# Patient Record
Sex: Male | Born: 1955 | Race: Black or African American | Hispanic: No | Marital: Single | Smoking: Former smoker
Health system: Southern US, Community
[De-identification: ages and names within clinical notes are randomized; demographics above are authoritative.]

## PROBLEM LIST (undated history)

## (undated) DIAGNOSIS — U071 COVID-19: Secondary | ICD-10-CM

## (undated) DIAGNOSIS — J449 Chronic obstructive pulmonary disease, unspecified: Secondary | ICD-10-CM

## (undated) HISTORY — DX: COVID-19: U07.1

---

## 2007-04-26 ENCOUNTER — Emergency Department: Payer: Self-pay | Admitting: Emergency Medicine

## 2012-07-08 ENCOUNTER — Emergency Department: Payer: Self-pay | Admitting: Unknown Physician Specialty

## 2012-07-09 LAB — COMPREHENSIVE METABOLIC PANEL
Alkaline Phosphatase: 62 U/L (ref 50–136)
Anion Gap: 9 (ref 7–16)
BUN: 20 mg/dL — ABNORMAL HIGH (ref 7–18)
Bilirubin,Total: 0.3 mg/dL (ref 0.2–1.0)
Calcium, Total: 8.9 mg/dL (ref 8.5–10.1)
Chloride: 112 mmol/L — ABNORMAL HIGH (ref 98–107)
Co2: 24 mmol/L (ref 21–32)
EGFR (African American): >60
EGFR (Non-African Amer.): >60
Osmolality: 291 (ref 275–301)
Potassium: 4.2 mmol/L (ref 3.5–5.1)

## 2012-07-09 LAB — CBC
HCT: 36.9 % — ABNORMAL LOW (ref 40.0–52.0)
MCH: 30.6 pg (ref 26.0–34.0)
MCHC: 32.6 g/dL (ref 32.0–36.0)
MCV: 94 fL (ref 80–100)
Platelet: 177 10*3/uL (ref 150–440)
RDW: 14.5 % (ref 11.5–14.5)

## 2012-07-09 LAB — PROTIME-INR: Prothrombin Time: 12.6 secs (ref 11.5–14.7)

## 2012-07-09 LAB — APTT: Activated PTT: 30.5 secs (ref 23.6–35.9)

## 2016-09-03 ENCOUNTER — Emergency Department
Admission: EM | Admit: 2016-09-03 | Discharge: 2016-09-03 | Disposition: A | Discharge status: Home / Self Care | Payer: Self-pay | Attending: Emergency Medicine | Admitting: Emergency Medicine

## 2016-09-03 DIAGNOSIS — K047 Periapical abscess without sinus: Secondary | ICD-10-CM | POA: Insufficient documentation

## 2016-09-03 MED ORDER — AMOXICILLIN 500 MG PO CAPS: 500.0000 mg | ORAL_CAPSULE | Freq: Three times a day (TID) | ORAL | 0 refills | Status: DC

## 2016-09-03 MED ORDER — TRAMADOL HCL 50 MG PO TABS: 50.0000 mg | ORAL_TABLET | Freq: Four times a day (QID) | ORAL | 0 refills | Status: DC | PRN

## 2016-09-03 NOTE — ED Triage Notes (Signed)
My here for dental abscess.

## 2016-09-03 NOTE — Discharge Instructions (Signed)
Begin medications today. A list of dental clinics is listed below.  OPTIONS FOR DENTAL FOLLOW UP CARE  [place] [facility] - Local Safety Net Dental Clinics [email or web address]   [facility] [phone])  [facility] [phone])  [place] [phone])  [facility] [phone])  [facility] [phone]) This clinic caters to the indigent population and is on a lottery system. Location: [employer] [facility], [employer], [address] [place] Clinic Hours: Wednesdays from 6pm - 9pm, patients seen by a lottery system. For dates, call or go to [email or web address] Services: Cleanings, fillings and simple extractions. Payment Options: DENTAL WORK IS FREE OF CHARGE. Bring proof of income or support. Best way to get seen: Arrive at 5:15 pm - this is a lottery, NOT first come/first serve, so arriving earlier will not increase your chances of being seen.     [facility] [phone] Select option 1 for emergencies   Location: [facility], [place], [address] [place] Clinic Hours: No walk-ins accepted - call the day before to schedule an appointment. Check in times are 9:30 am and 1:30 pm. Services: Simple extractions, temporary fillings, pulpectomy/pulp debridement, uncomplicated abscess drainage. Payment Options: PAYMENT IS DUE AT THE TIME OF SERVICE.  Fee is usually $100-200, additional surgical procedures (e.g. abscess drainage) may be extra. Cash, checks, Visa/MasterCard accepted.  Can file Medicaid if patient is covered for dental - patient should call case worker to check. No discount for [facility] patients. Best way to get seen: MUST call the day before and get onto the schedule. Can usually be seen the next 1-2 days. No walk-ins  accepted.     [facility] [phone]   Location: [facility], [address], [place] Clinic Hours: M, W, Th, F 8am or 1:30pm, Tues 9a or 1:30 - first come/first served. Services: Simple extractions, temporary fillings, uncomplicated abscess drainage.  You do not need to be an [facility] resident. Payment Options: PAYMENT IS DUE AT THE TIME OF SERVICE. Dental insurance, otherwise sliding scale - bring proof of income or support. Depending on income and treatment needed, cost is usually $50-200. Best way to get seen: Arrive early as it is first come/first served.     [facility] [phone]   Location: [address] Clinic Hours: Mon-Thu 8a-5p Services: Most basic dental services including extractions and fillings. Payment Options: PAYMENT IS DUE AT THE TIME OF SERVICE. Sliding scale, up to 50% off - bring proof if income or support. Medicaid with dental option accepted. Best way to get seen: Call to schedule an appointment, can usually be seen within 2 weeks OR they will try to see walk-ins - show up at 8a or 2p (you may have to wait).     [facility] [phone] [place] RESIDENTS ONLY   Location: [facility], [address], [place], [place] [address] Clinic Hours: By appointment only. Monday - Thursday 8am-5pm, Friday 8am-12pm Services: Cleanings, fillings, extractions. Payment Options: PAYMENT IS DUE AT THE TIME OF SERVICE. Cash, Visa or MasterCard. Sliding scale - $30 minimum per service. Best way to get seen: Come in to office, complete packet and make an appointment - need proof of income or support monies for each household member and proof of [facility] residence. Usually takes about a month to get in.  [facility] [phone]   Location: [address] [facility] Hours: Walk-in  Urgent Care Dental Services are offered Monday-Friday mornings only. The numbers of emergencies accepted daily is limited to the number of providers available. Maximum 15 - Mondays, Wednesdays & Thursdays Maximum 10 - Tuesdays & Fridays Services: You do not need to be a [place] [facility] resident to be seen for a dental emergency. Emergencies are defined as pain, swelling, abnormal bleeding, or dental trauma. Walkins will receive x-rays if needed. NOTE: Dental cleaning is not an emergency. Payment Options: PAYMENT IS DUE AT THE TIME OF SERVICE. Minimum co-pay is $40.00 for uninsured patients. Minimum co-pay is $3.00 for Medicaid with dental coverage. Dental Insurance is accepted and must be presented at time of visit. Medicare does not cover dental. Forms of payment: Cash, credit card, checks. Best way to get seen: If not previously registered with the clinic, walk-in dental registration begins at 7:15 am and is on a first come/first serve basis. If previously registered with the clinic, call to make an appointment.     [facility] [phone] [place] RESIDENTS ONLY   Location: [address], [place], [place] Clinic Hours: Mon-Thu 10a-2p Services: Extractions only! Payment Options: FREE (donations accepted) - bring proof of income or support Best way to get seen: Call and schedule an appointment OR come at 8am on the 1st Monday of every month (except for holidays) when it is first come/first served.     [facility] [phone]   Location: [address], [place] Clinic Hours: Friday mornings Services, Payment Options, Best way to get seen: Call for info

## 2016-09-03 NOTE — ED Provider Notes (Signed)
[facility] Emergency Department Provider Note  ____________________________________________   First MD Initiated Contact with Patient 09/03/16 1057     (approximate)  I have reviewed the triage vital signs and the nursing notes.   HISTORY  Chief Complaint Dental Problem   HPI Randy Ray is a 60 y.o. male is here with complaint of dental pain.Patient states that last evening his lower jaw was swollen however after using over-the-counter preparations and compresses it is reduced this morning. He states that he has been having problems with his teeth but have not made a dental appointment. Family member with him states he does have an appointment on Monday at [facility].  Patient denies any allergies to medications.   No past medical history on file.  There are no active problems to display for this patient.   No past surgical history on file.  Prior to Admission medications   Medication Sig Start Date End Date Taking? Authorizing Provider  amoxicillin (AMOXIL) 500 MG capsule Take 1 capsule (500 mg total) by mouth 3 (three) times daily. 09/03/16   Tommi Rumpshonda L Summers, PA-C  traMADol (ULTRAM) 50 MG tablet Take 1 tablet (50 mg total) by mouth every 6 (six) hours as needed. 09/03/16   Tommi Rumpshonda L Summers, PA-C    Allergies Review of patient's allergies indicates not on file.  No family history on file.  Social History Social History  Substance Use Topics  . Smoking status: Not on file  . Smokeless tobacco: Not on file  . Alcohol use Not on file    Review of Systems Constitutional: No fever/chills ENT: No sore throat.Positive for dental pain. Cardiovascular: Denies chest pain. Respiratory: Denies shortness of breath. Gastrointestinal:   No nausea, no vomiting.  Musculoskeletal: Negative for back pain. Skin: Negative for rash. Neurological: Negative for headaches  10-point ROS otherwise  negative.  ____________________________________________   PHYSICAL EXAM:  VITAL SIGNS: ED Triage Vitals  Enc Vitals Group     BP      Pulse      Resp      Temp      Temp src      SpO2      Weight      Height      Head Circumference      Peak Flow      Pain Score      Pain Loc      Pain Edu?      Excl. in GC?     Constitutional: Alert and oriented. Well appearing and in no acute distress. Eyes: Conjunctivae are normal. PERRL. EOMI. Head: Atraumatic. Nose: No congestion/rhinnorhea. Mouth/Throat: Mucous membranes are moist.  Oropharynx non-erythematous.For dental hygiene and repair. There are multiple teeth with enamel erosion. There is some moderate tenderness of the gum on palpation with a tongue depressor. No active drainage is seen from this area. Neck: No stridor.   Hematological/Lymphatic/Immunilogical: No cervical lymphadenopathy. Cardiovascular: Normal rate, regular rhythm. Grossly normal heart sounds.  Good peripheral circulation. Respiratory: Normal respiratory effort.  No retractions. Lungs CTAB. Musculoskeletal: No lower extremity tenderness nor edema.  No joint effusions. Neurologic:  Normal speech and language. No gross focal neurologic deficits are appreciated. No gait instability. Skin:  Skin is warm, dry and intact. No rash noted. Psychiatric: Mood and affect are normal. Speech and behavior are normal.  ____________________________________________   LABS (all labs ordered are listed, but only abnormal results are displayed)  Labs Reviewed - No data to display  PROCEDURES  Procedure(s) performed: None  Procedures  Critical Care performed: No  ____________________________________________   INITIAL IMPRESSION / ASSESSMENT AND PLAN / ED COURSE  Pertinent labs & imaging results that were available during my care of the patient were reviewed by me and considered in my medical decision making (see chart for details).    Clinical Course    Patient was given a prescription for amoxicillin 500 mg 3 times a day for 10 days along with tramadol 1 every 6 hours as needed for pain. Patient is to keep his appointment at [facility] Monday.  ____________________________________________   FINAL CLINICAL IMPRESSION(S) / ED DIAGNOSES  Final diagnoses:  Dental abscess      NEW MEDICATIONS STARTED DURING THIS VISIT:  Discharge Medication List as of 09/03/2016 11:14 AM    START taking these medications   Details  amoxicillin (AMOXIL) 500 MG capsule Take 1 capsule (500 mg total) by mouth 3 (three) times daily., Starting Fri 09/03/2016, Print    traMADol (ULTRAM) 50 MG tablet Take 1 tablet (50 mg total) by mouth every 6 (six) hours as needed., Starting Fri 09/03/2016, Print         Note:  This document was prepared using Dragon voice recognition software and may include unintentional dictation errors.    Tommi Rumps, PA-C 09/03/16 1447    Jene Every, MD 09/03/16 [phone]

## 2017-08-20 ENCOUNTER — Emergency Department
Admission: EM | Admit: 2017-08-20 | Discharge: 2017-08-20 | Disposition: A | Discharge status: Home / Self Care | Payer: Self-pay | Attending: Emergency Medicine | Admitting: Emergency Medicine

## 2017-08-20 ENCOUNTER — Encounter: Payer: Self-pay | Admitting: Emergency Medicine

## 2017-08-20 DIAGNOSIS — F17210 Nicotine dependence, cigarettes, uncomplicated: Secondary | ICD-10-CM | POA: Insufficient documentation

## 2017-08-20 DIAGNOSIS — K029 Dental caries, unspecified: Secondary | ICD-10-CM | POA: Insufficient documentation

## 2017-08-20 MED ORDER — AMOXICILLIN 500 MG PO CAPS: 500.0000 mg | ORAL_CAPSULE | Freq: Three times a day (TID) | ORAL | 0 refills | Status: DC

## 2017-08-20 MED ORDER — TRAMADOL HCL 50 MG PO TABS: 50.0000 mg | ORAL_TABLET | Freq: Four times a day (QID) | ORAL | 0 refills | Status: DC | PRN

## 2017-08-20 NOTE — ED Notes (Signed)
Discussed discharge instructions, prescriptions, and follow-up care with patient. No questions or concerns at this time. Pt stable at discharge.  

## 2017-08-20 NOTE — Discharge Instructions (Signed)
Begin taking amoxicillin 500 mg 3 times a day for 10 days and tramadol over the next 3 days as needed for pain. Follow-up with [facility] dental clinic for your multiple dental cavities.

## 2017-08-20 NOTE — ED Provider Notes (Signed)
[facility] Emergency Department Provider Note  ____________________________________________   First MD Initiated Contact with Patient 08/20/17 1126     (approximate)  I have reviewed the triage vital signs and the nursing notes.   HISTORY  Chief Complaint Dental Pain   HPI Randy Ray is a 61 y.o. male is here with complaint of dental pain that began Thursday night. He states all his teeth above are "breaking off". Patient has been seen in the past for the same teeth but has not been seen by the dentist. He states his plans are to go to [facility] on Monday for the walk-in clinic. Currently rates his pain as 10 over 10.  History reviewed. No pertinent past medical history.  There are no active problems to display for this patient.   History reviewed. No pertinent surgical history.  Prior to Admission medications   Medication Sig Start Date End Date Taking? Authorizing Provider  amoxicillin (AMOXIL) 500 MG capsule Take 1 capsule (500 mg total) by mouth 3 (three) times daily. 08/20/17   Tommi Rumps, PA-C  traMADol (ULTRAM) 50 MG tablet Take 1 tablet (50 mg total) by mouth every 6 (six) hours as needed. 08/20/17   Tommi Rumps, PA-C    Allergies Patient has no known allergies.  No family history on file.  Social History Social History  Substance Use Topics  . Smoking status: Current Every Day Smoker    Packs/day: 0.50    Types: Cigarettes  . Smokeless tobacco: Not on file  . Alcohol use Not on file    Review of Systems  Constitutional: No fever/chills Eyes: No visual changes. ENT: Positive dental pain. Cardiovascular: Denies chest pain. Respiratory: Denies shortness of breath. Gastrointestinal:  No nausea, no vomiting.   Neurological: Negative for headaches, focal weakness or numbness. ____________________________________________   PHYSICAL EXAM:  VITAL SIGNS: ED Triage Vitals  Enc Vitals Group     BP 08/20/17 1053  124/90     Pulse Rate 08/20/17 1053 79     Resp 08/20/17 1053 20     Temp 08/20/17 1053 97.9 F (36.6 C)     Temp Source 08/20/17 1053 Oral     SpO2 08/20/17 1053 100 %     Weight 08/20/17 1053 205 lb (93 kg)     Height 08/20/17 1053 5\' 8"  (1.727 m)     Head Circumference --      Peak Flow --      Pain Score 08/20/17 1052 10     Pain Loc --      Pain Edu? --      Excl. in GC? --    Constitutional: Alert and oriented. Well appearing and in no acute distress. Eyes: Conjunctivae are normal.  Head: Atraumatic. Mouth/Throat: All upper  frontal teeth are in extremely poor repair and hygiene. Most of these teeth are broken off at the gum. Area is swollen. No active drainage is noted at this time. Neck: No stridor.   Hematological/Lymphatic/Immunilogical: No cervical lymphadenopathy. Cardiovascular: Normal rate, regular rhythm. Grossly normal heart sounds.  Good peripheral circulation. Respiratory: Normal respiratory effort.  No retractions. Lungs CTAB. Musculoskeletal: Moves upper and lower extremities without difficulty and normal gait was noted. Neurologic:  Normal speech and language. No gross focal neurologic deficits are appreciated.  Psychiatric: Mood and affect are normal. Speech and behavior are normal.  ____________________________________________   LABS (all labs ordered are listed, but only abnormal results are displayed)  Labs Reviewed - No  data to display   PROCEDURES  Procedure(s) performed: None  Procedures  Critical Care performed: No  ____________________________________________   INITIAL IMPRESSION / ASSESSMENT AND PLAN / ED COURSE  Pertinent labs & imaging results that were available during my care of the patient were reviewed by me and considered in my medical decision making (see chart for details).  Patient with chronic-appearing dental caries with poor dental hygiene. Encouraged patient to follow through with [facility] dental clinic. He is given  a prescription for amoxicillin 500 mg 3 times a day 10 days and tramadol 50 mg 1 every 6 hours #12.   ____________________________________________   FINAL CLINICAL IMPRESSION(S) / ED DIAGNOSES  Final diagnoses:  Pain due to dental caries      NEW MEDICATIONS STARTED DURING THIS VISIT:  Discharge Medication List as of 08/20/2017 12:28 PM       Note:  This document was prepared using Dragon voice recognition software and may include unintentional dictation errors.    Tommi Rumps, PA-C 08/20/17 1321    Merrily Brittle, MD 08/20/17 1414

## 2017-08-20 NOTE — ED Triage Notes (Signed)
Upper front dental pain x 2 to 3 days. Has had broken tooth there.

## 2020-08-27 ENCOUNTER — Inpatient Hospital Stay
Admission: EM | Admit: 2020-08-27 | Discharge: 2020-09-13 | DRG: 177 | Disposition: A | Discharge status: Home / Self Care | Payer: HRSA Program | Attending: Internal Medicine | Admitting: Internal Medicine

## 2020-08-27 ENCOUNTER — Emergency Department: Payer: HRSA Program

## 2020-08-27 ENCOUNTER — Other Ambulatory Visit: Payer: Self-pay

## 2020-08-27 ENCOUNTER — Encounter: Payer: Self-pay | Admitting: Emergency Medicine

## 2020-08-27 DIAGNOSIS — U071 COVID-19: Principal | ICD-10-CM | POA: Diagnosis present

## 2020-08-27 DIAGNOSIS — F17210 Nicotine dependence, cigarettes, uncomplicated: Secondary | ICD-10-CM | POA: Diagnosis present

## 2020-08-27 DIAGNOSIS — J440 Chronic obstructive pulmonary disease with acute lower respiratory infection: Secondary | ICD-10-CM | POA: Diagnosis present

## 2020-08-27 DIAGNOSIS — I712 Thoracic aortic aneurysm, without rupture: Secondary | ICD-10-CM | POA: Diagnosis not present

## 2020-08-27 DIAGNOSIS — Z79899 Other long term (current) drug therapy: Secondary | ICD-10-CM

## 2020-08-27 DIAGNOSIS — J9621 Acute and chronic respiratory failure with hypoxia: Secondary | ICD-10-CM | POA: Diagnosis present

## 2020-08-27 DIAGNOSIS — R0602 Shortness of breath: Secondary | ICD-10-CM | POA: Diagnosis present

## 2020-08-27 DIAGNOSIS — J189 Pneumonia, unspecified organism: Secondary | ICD-10-CM | POA: Insufficient documentation

## 2020-08-27 DIAGNOSIS — J1282 Pneumonia due to coronavirus disease 2019: Secondary | ICD-10-CM | POA: Diagnosis present

## 2020-08-27 DIAGNOSIS — N179 Acute kidney failure, unspecified: Secondary | ICD-10-CM | POA: Diagnosis present

## 2020-08-27 DIAGNOSIS — D696 Thrombocytopenia, unspecified: Secondary | ICD-10-CM | POA: Diagnosis not present

## 2020-08-27 DIAGNOSIS — J449 Chronic obstructive pulmonary disease, unspecified: Secondary | ICD-10-CM

## 2020-08-27 DIAGNOSIS — D6959 Other secondary thrombocytopenia: Secondary | ICD-10-CM | POA: Diagnosis present

## 2020-08-27 DIAGNOSIS — J9600 Acute respiratory failure, unspecified whether with hypoxia or hypercapnia: Secondary | ICD-10-CM | POA: Diagnosis present

## 2020-08-27 DIAGNOSIS — I7122 Aneurysm of the aortic arch, without rupture: Secondary | ICD-10-CM

## 2020-08-27 DIAGNOSIS — J9601 Acute respiratory failure with hypoxia: Secondary | ICD-10-CM | POA: Diagnosis not present

## 2020-08-27 DIAGNOSIS — R0902 Hypoxemia: Secondary | ICD-10-CM

## 2020-08-27 LAB — COMPREHENSIVE METABOLIC PANEL
ALT: 33 U/L (ref 0–44)
AST: 79 U/L — ABNORMAL HIGH (ref 15–41)
Albumin: 3.9 g/dL (ref 3.5–5.0)
Alkaline Phosphatase: 50 U/L (ref 38–126)
Anion gap: 17 — ABNORMAL HIGH (ref 5–15)
BUN: 46 mg/dL — ABNORMAL HIGH (ref 8–23)
CO2: 18 mmol/L — ABNORMAL LOW (ref 22–32)
Calcium: 8.9 mg/dL (ref 8.9–10.3)
Chloride: 102 mmol/L (ref 98–111)
Creatinine, Ser: 2.57 mg/dL — ABNORMAL HIGH (ref 0.61–1.24)
GFR calc Af Amer: 30 mL/min — ABNORMAL LOW (ref >60)
GFR calc non Af Amer: 26 mL/min — ABNORMAL LOW (ref >60)
Glucose, Bld: 133 mg/dL — ABNORMAL HIGH (ref 70–99)
Potassium: 3.5 mmol/L (ref 3.5–5.1)
Sodium: 137 mmol/L (ref 135–145)
Total Bilirubin: 1.5 mg/dL — ABNORMAL HIGH (ref 0.3–1.2)
Total Protein: 8.3 g/dL — ABNORMAL HIGH (ref 6.5–8.1)

## 2020-08-27 LAB — CBC WITH DIFFERENTIAL/PLATELET
Abs Immature Granulocytes: 0.02 10*3/uL (ref 0.00–0.07)
Basophils Absolute: 0 10*3/uL (ref 0.0–0.1)
Basophils Relative: 0 %
Eosinophils Absolute: 0 10*3/uL (ref 0.0–0.5)
Eosinophils Relative: 0 %
HCT: 42.6 % (ref 39.0–52.0)
Hemoglobin: 14.7 g/dL (ref 13.0–17.0)
Immature Granulocytes: 1 %
Lymphocytes Relative: 9 %
Lymphs Abs: 0.4 10*3/uL — ABNORMAL LOW (ref 0.7–4.0)
MCH: 32 pg (ref 26.0–34.0)
MCHC: 34.5 g/dL (ref 30.0–36.0)
MCV: 92.8 fL (ref 80.0–100.0)
Monocytes Absolute: 0.1 10*3/uL (ref 0.1–1.0)
Monocytes Relative: 3 %
Neutro Abs: 3.8 10*3/uL (ref 1.7–7.7)
Neutrophils Relative %: 87 %
Platelets: 97 10*3/uL — ABNORMAL LOW (ref 150–400)
RBC: 4.59 MIL/uL (ref 4.22–5.81)
RDW: 13.2 % (ref 11.5–15.5)
WBC: 4.4 10*3/uL (ref 4.0–10.5)
nRBC: 0 % (ref 0.0–0.2)

## 2020-08-27 LAB — PROTIME-INR
INR: 1.1 (ref 0.8–1.2)
Prothrombin Time: 13.4 seconds (ref 11.4–15.2)

## 2020-08-27 LAB — URINALYSIS, COMPLETE (UACMP) WITH MICROSCOPIC
Bilirubin Urine: NEGATIVE
Glucose, UA: NEGATIVE mg/dL
Ketones, ur: NEGATIVE mg/dL
Leukocytes,Ua: NEGATIVE
Nitrite: NEGATIVE
Protein, ur: 100 mg/dL — AB
Specific Gravity, Urine: 1.017 (ref 1.005–1.030)
pH: 5 (ref 5.0–8.0)

## 2020-08-27 LAB — ABO/RH: ABO/RH(D): A POS

## 2020-08-27 LAB — PROCALCITONIN: Procalcitonin: 13.57 ng/mL

## 2020-08-27 LAB — APTT: aPTT: 39 seconds — ABNORMAL HIGH (ref 24–36)

## 2020-08-27 LAB — BRAIN NATRIURETIC PEPTIDE: B Natriuretic Peptide: 37.8 pg/mL (ref 0.0–100.0)

## 2020-08-27 LAB — LACTIC ACID, PLASMA: Lactic Acid, Venous: 2.1 mmol/L (ref 0.5–1.9)

## 2020-08-27 LAB — SARS CORONAVIRUS 2 BY RT PCR (HOSPITAL ORDER, PERFORMED IN ~~LOC~~ HOSPITAL LAB): SARS Coronavirus 2: POSITIVE — AB

## 2020-08-27 MED ORDER — SODIUM CHLORIDE 0.9 % IV SOLN
100.0000 mg | Freq: Every day | INTRAVENOUS | Status: AC
  Administered 2020-08-28 – 2020-08-31 (×4): 100 mg via INTRAVENOUS
  Filled 2020-08-27 (×4): qty 100

## 2020-08-27 MED ORDER — SODIUM CHLORIDE 0.9% FLUSH
3.0000 mL | Freq: Two times a day (BID) | INTRAVENOUS | Status: DC
  Administered 2020-08-28 – 2020-09-13 (×32): 3 mL via INTRAVENOUS

## 2020-08-27 MED ORDER — SODIUM CHLORIDE 0.9 % IV SOLN
500.0000 mg | INTRAVENOUS | Status: DC
  Administered 2020-08-27 – 2020-08-29 (×3): 500 mg via INTRAVENOUS
  Filled 2020-08-27 (×4): qty 500

## 2020-08-27 MED ORDER — ALBUTEROL SULFATE HFA 108 (90 BASE) MCG/ACT IN AERS
2.0000 | INHALATION_SPRAY | Freq: Four times a day (QID) | RESPIRATORY_TRACT | Status: DC
  Administered 2020-08-27 – 2020-08-28 (×5): 2 via RESPIRATORY_TRACT
  Filled 2020-08-27: qty 6.7

## 2020-08-27 MED ORDER — ONDANSETRON HCL 4 MG/2ML IJ SOLN: 4.0000 mg | Freq: Four times a day (QID) | INTRAMUSCULAR | Status: DC | PRN

## 2020-08-27 MED ORDER — GUAIFENESIN-DM 100-10 MG/5ML PO SYRP
10.0000 mL | ORAL_SOLUTION | ORAL | Status: DC | PRN
  Administered 2020-08-30 – 2020-09-09 (×6): 10 mL via ORAL
  Filled 2020-08-27 (×7): qty 10

## 2020-08-27 MED ORDER — ASCORBIC ACID 500 MG PO TABS
500.0000 mg | ORAL_TABLET | Freq: Every day | ORAL | Status: DC
  Administered 2020-08-27 – 2020-09-13 (×18): 500 mg via ORAL
  Filled 2020-08-27 (×18): qty 1

## 2020-08-27 MED ORDER — LACTATED RINGERS IV BOLUS (SEPSIS)
500.0000 mL | Freq: Once | INTRAVENOUS | Status: AC
  Administered 2020-08-27: 500 mL via INTRAVENOUS

## 2020-08-27 MED ORDER — DEXAMETHASONE 4 MG PO TABS: 6.0000 mg | ORAL_TABLET | ORAL | Status: DC

## 2020-08-27 MED ORDER — ENOXAPARIN SODIUM 40 MG/0.4ML ~~LOC~~ SOLN
40.0000 mg | SUBCUTANEOUS | Status: DC
  Administered 2020-08-27 – 2020-09-12 (×17): 40 mg via SUBCUTANEOUS
  Filled 2020-08-27 (×17): qty 0.4

## 2020-08-27 MED ORDER — ZINC SULFATE 220 (50 ZN) MG PO CAPS
220.0000 mg | ORAL_CAPSULE | Freq: Every day | ORAL | Status: DC
  Administered 2020-08-27 – 2020-09-13 (×18): 220 mg via ORAL
  Filled 2020-08-27 (×18): qty 1

## 2020-08-27 MED ORDER — SODIUM CHLORIDE 0.9 % IV SOLN
1.0000 g | INTRAVENOUS | Status: DC
  Administered 2020-08-27 – 2020-09-02 (×7): 1 g via INTRAVENOUS
  Filled 2020-08-27: qty 10
  Filled 2020-08-27: qty 1
  Filled 2020-08-27 (×2): qty 10
  Filled 2020-08-27 (×2): qty 1
  Filled 2020-08-27: qty 10
  Filled 2020-08-27: qty 1

## 2020-08-27 MED ORDER — SODIUM CHLORIDE 0.9% FLUSH: 3.0000 mL | INTRAVENOUS | Status: DC | PRN

## 2020-08-27 MED ORDER — SODIUM CHLORIDE 0.9 % IV SOLN
200.0000 mg | Freq: Once | INTRAVENOUS | Status: AC
  Administered 2020-08-27: 200 mg via INTRAVENOUS
  Filled 2020-08-27: qty 40

## 2020-08-27 MED ORDER — ONDANSETRON HCL 4 MG PO TABS: 4.0000 mg | ORAL_TABLET | Freq: Four times a day (QID) | ORAL | Status: DC | PRN

## 2020-08-27 MED ORDER — METHYLPREDNISOLONE SODIUM SUCC 125 MG IJ SOLR
125.0000 mg | INTRAMUSCULAR | Status: AC
  Administered 2020-08-27: 125 mg via INTRAVENOUS
  Filled 2020-08-27: qty 2

## 2020-08-27 MED ORDER — IPRATROPIUM-ALBUTEROL 20-100 MCG/ACT IN AERS
2.0000 | INHALATION_SPRAY | RESPIRATORY_TRACT | Status: AC
  Administered 2020-08-27: 2 via RESPIRATORY_TRACT
  Filled 2020-08-27: qty 4

## 2020-08-27 MED ORDER — SODIUM CHLORIDE 0.9 % IV SOLN
250.0000 mL | INTRAVENOUS | Status: DC | PRN
  Administered 2020-08-30 – 2020-09-02 (×2): 250 mL via INTRAVENOUS

## 2020-08-27 MED ORDER — DEXAMETHASONE SODIUM PHOSPHATE 10 MG/ML IJ SOLN
6.0000 mg | INTRAMUSCULAR | Status: DC
  Administered 2020-08-28: 6 mg via INTRAVENOUS
  Filled 2020-08-27: qty 1

## 2020-08-27 NOTE — H&P (Addendum)
History and Physical    ALLAN MINOTTI WKM:[id number] DOB: 10-10-1956 DOA: 08/27/2020  PCP: Patient, No Pcp Per   Patient coming from: Home  I have personally briefly reviewed patient's old medical records in [facility]  Chief Complaint: Shortness of breath                               Fever  HPI: RIELEY HAUSMAN is a 64 y.o. male with medical history no significant past medical history who was brought into the ER by EMS for evaluation of fever, cough and shortness of breath. Patient stated that for over a week he has been experiencing intermittent fever, body aches and a dry cough.  He also complains of poor appetite.  Over the last 2 days he developed shortness of breath which has progressively worsened.  He denies having any chest pains, no palpitations, no diaphoresis, no nausea, no vomiting or any changes in his bowel habits. When EMS arrived patient was found to have room air pulse oximetry of 76% and was placed on a nonrebreather mask with O2 at 10 L improving his pulse oximetry to 93%.  He also received bronchodilator therapy. Patient is unvaccinated and denies having any Covid exposure. Upon arrival to the ER he received Solu-Medrol 125mg  IV x 1 dose since he was wheezing. Presents with chief complaint of breathing, creatinine 2.57, BNP 37.8 lactic acid 2.1, procalcitonin 13.5, white count 4.4 with a left shift. His SARS coronavirus 2 test is positive Chest x-ray reviewed by me shows bibasilar opacities consistent with viral pneumonia Twelve-lead EKG reviewed by me shows sinus tachycardia   ED Course: Patient is a 64 year old African-American male who was brought into the ER by EMS for evaluation of shortness of breath and hypoxia in the field.  Patient is unvaccinated.  He received a dose of Solu-Medrol 125 mg as well as remdesivir in the ER and admitted to the hospital for further evaluation.  Review of Systems: As per HPI otherwise 10 point review of systems  negative.   History reviewed. No pertinent past medical history.  History reviewed. No pertinent surgical history.   reports that he has been smoking cigarettes. He has been smoking about 0.50 packs per day. He has never used smokeless tobacco. He reports previous alcohol use. He reports previous drug use.  Allergies  Allergen Reactions  . Bee Pollen Shortness Of Breath    History reviewed. No pertinent family history.   Prior to Admission medications   Not on File    Physical Exam: Vitals:   08/27/20 1051 08/27/20 1052 08/27/20 1200 08/27/20 1300  BP: 113/75  114/69 117/75  Pulse: 93  (!) 104 99  Resp: 18   18  Temp: 98 F (36.7 C)     TempSrc: Oral     SpO2: 91%  94% 97%  Weight:  93 kg    Height:  5\' 7"  (1.702 m)       Vitals:   08/27/20 1051 08/27/20 1052 08/27/20 1200 08/27/20 1300  BP: 113/75  114/69 117/75  Pulse: 93  (!) 104 99  Resp: 18   18  Temp: 98 F (36.7 C)     TempSrc: Oral     SpO2: 91%  94% 97%  Weight:  93 kg    Height:  5\' 7"  (1.702 m)      Constitutional: NAD, alert and oriented x 3.  Acutely ill-appearing Eyes: PERRL,  lids and conjunctivae pallor ENMT: Mucous membranes are moist.  Neck: normal, supple, no masses, no thyromegaly Respiratory: Air movement in all lung fields, no wheezing, no crackles. Normal respiratory effort. No accessory muscle use.  Cardiovascular: Regular rate and rhythm,no murmurs / rubs / gallops. No extremity edema. 2+ pedal pulses. No carotid bruits.  Abdomen: no tenderness, no masses palpated. No hepatosplenomegaly. Bowel sounds positive.  Musculoskeletal: no clubbing / cyanosis. No joint deformity upper and lower extremities.  Skin: no rashes, lesions, ulcers.  Neurologic: No gross focal neurologic deficit. Psychiatric: Normal mood and affect.   Labs on Admission: I have personally reviewed following labs and imaging studies  CBC: Recent Labs  Lab 08/27/20 1108  WBC 4.4  NEUTROABS 3.8  HGB 14.7  HCT  42.6  MCV 92.8  PLT 97*   Basic Metabolic Panel: Recent Labs  Lab 08/27/20 1108  NA 137  K 3.5  CL 102  CO2 18*  GLUCOSE 133*  BUN 46*  CREATININE 2.57*  CALCIUM 8.9   GFR: Estimated Creatinine Clearance: 32.4 mL/min (A) (by C-G formula based on SCr of 2.57 mg/dL (H)). Liver Function Tests: Recent Labs  Lab 08/27/20 1108  AST 79*  ALT 33  ALKPHOS 50  BILITOT 1.5*  PROT 8.3*  ALBUMIN 3.9   No results for input(s): LIPASE, AMYLASE in the last 168 hours. No results for input(s): AMMONIA in the last 168 hours. Coagulation Profile: Recent Labs  Lab 08/27/20 1108  INR 1.1   Cardiac Enzymes: No results for input(s): CKTOTAL, CKMB, CKMBINDEX, TROPONINI in the last 168 hours. BNP (last 3 results) No results for input(s): PROBNP in the last 8760 hours. HbA1C: No results for input(s): HGBA1C in the last 72 hours. CBG: No results for input(s): GLUCAP in the last 168 hours. Lipid Profile: No results for input(s): CHOL, HDL, LDLCALC, TRIG, CHOLHDL, LDLDIRECT in the last 72 hours. Thyroid Function Tests: No results for input(s): TSH, T4TOTAL, FREET4, T3FREE, THYROIDAB in the last 72 hours. Anemia Panel: No results for input(s): VITAMINB12, FOLATE, FERRITIN, TIBC, IRON, RETICCTPCT in the last 72 hours. Urine analysis: No results found for: COLORURINE, APPEARANCEUR, LABSPEC, PHURINE, GLUCOSEU, HGBUR, BILIRUBINUR, KETONESUR, PROTEINUR, UROBILINOGEN, NITRITE, LEUKOCYTESUR  Radiological Exams on Admission: DG Chest Portable 1 View  Result Date: 08/27/2020 CLINICAL DATA:  Dyspnea, dry cough for a week. EXAM: PORTABLE CHEST 1 VIEW COMPARISON:  None FINDINGS: Image rotated slightly to the RIGHT. Accounting for this the cardiomediastinal contours and hilar structures are normal. Patchy basilar airspace disease bilaterally with both interstitial and airspace component. No sign of pleural effusion. On limited assessment skeletal structures without acute process. IMPRESSION: Patchy  basilar airspace disease bilaterally with both interstitial and airspace component. Findings could be seen in the setting of atypical or multifocal pneumonia. COVID-19 infection could be considered. Electronically Signed   By: Donzetta Kohut M.D.   On: 08/27/2020 11:11    EKG: Independently reviewed.  Sinus tachycardia   Assessment/Plan Principal Problem:   Pneumonia due to COVID-19 virus Active Problems:   Thrombocytopenia (HCC)   Acute respiratory failure due to COVID-19 [facility])      Pneumonia and acute respiratory failure due to COVID-19 virus Patient has had symptoms of intermittent fever, dry cough for about a week but developed shortness of breath over the last 2 days He was noted to be hypoxic in the field with room air pulse oximetry of 76% and required oxygen supplementation with a nonrebreather mask at 15 L to maintain pulse oximetry of 98% He  is currently on high flow nasal cannula We will place patient on remdesivir per protocol and Decadron 6 mg IV to complete a 10-day course of therapy Continue oxygen supplementation to maintain pulse oximetry greater than 92% Patient placed empirically on Rocephin and Zithromax for suspected superimposed bacterial pneumonia.  Procalcitonin level on admission was elevated at 13   Thrombocytopenia Unclear etiology may be related to acute viral illness Monitor closely for bleeding   Acute kidney injury At baseline patient serum creatinine was 0.67 but this was from 8 years ago and today on admission it is 2.57 I am unable to find any prior records   DVT prophylaxis: Lovenox Code Status: Full code Family Communication: Greater than 50% of time was spent discussing plan of care with patient at the bedside.  He verbalizes understanding and agrees with the plan. Disposition Plan: Back to previous home environment Consults called: None    Luvena Wentling MD [facility]     08/27/2020, 2:04 PM

## 2020-08-27 NOTE — ED Triage Notes (Signed)
[facility]. Per EMS, pt has had a dry cough for a week. Feeling SHOB and fever since Saturday. Does not report recent COVID vaccine.  Pt found 76% RA given 2x, placed on NRB at 10L plus albuterol tx resulting in 93%, NRB 15L resulting 98% .  In route given 2x albutetol. 1g acetaminophen.  EMS report expirator  Upon arrival pt tachycardic at 127bpm: BP 123/78  EDP Qual[place] at bedside.

## 2020-08-27 NOTE — ED Notes (Signed)
Pt provided lunch tray at bedside.  

## 2020-08-27 NOTE — ED Notes (Signed)
Assisted pt to toilet 

## 2020-08-27 NOTE — Consult Note (Signed)
Remdesivir - Pharmacy Brief Note   O:  ALT: 33 CXR: "Patchy basilar airspace disease bilaterally with both interstitial and airspace component. Findings could be seen in the setting of atypical or multifocal pneumonia. COVID-19 infection could be considered" SpO2: Hypoxic requiring supplemental oxygen   A/P:  08/27/20 SARS-CoV-2 PCR (+)  Remdesivir 200 mg IVPB once followed by 100 mg IVPB daily x 4 days.   Randy Ray 08/27/2020 1:55 PM

## 2020-08-27 NOTE — ED Provider Notes (Signed)
[facility] Emergency Department Provider Note   ____________________________________________   First MD Initiated Contact with Patient 08/27/20 1052     (approximate)  I have reviewed the triage vital signs and the nursing notes.   HISTORY  Chief Complaint Shortness of Breath and Fever  EM caveat: Dyspnea, acuity presentation and concerns for respiratory distress  HPI Randy Ray is a 64 y.o. male here for evaluation for fevers cough and shortness of breath  For 1 week patient has been experiencing intermittent fevers, aches, dry cough.  Become notably short of breath in the last day or 2.  No pain or discomfort.  No chest pain.  No headaches.  Taking no medications except took something for fever earlier today  Has not been vaccinated against Covid.  No known Covid exposure denies he does not know of having been around someone with it recently  No headache.  No nausea vomiting.  Decreased appetite.   EMS reports gave 2 albuterol nebulizers.  Patient was wheezing on their presentation.  Also hypoxic with saturations in the mid 70s  History reviewed. No pertinent past medical history.  Patient Active Problem List   Diagnosis Date Noted  . Pneumonia 08/27/2020    History reviewed. No pertinent surgical history.  Prior to Admission medications   Not on File    Allergies Bee pollen  History reviewed. No pertinent family history.  Social History Social History   Tobacco Use  . Smoking status: Current Every Day Smoker    Packs/day: 0.50    Types: Cigarettes  . Smokeless tobacco: Never Used  Vaping Use  . Vaping Use: Never used  Substance Use Topics  . Alcohol use: Not Currently  . Drug use: Not Currently  Patient is an everyday smoker  Review of Systems Constitutional: Fevers, no noted chills Eyes: No visual changes. ENT: No sore throat.  Feels dry. Cardiovascular: Denies chest pain. Respiratory: Shortness of breath.  Quite a  bit better after EMS treatment Gastrointestinal: No abdominal pain.   Musculoskeletal: Negative for back pain. Skin: Negative for rash. Neurological: Negative for headaches, areas of focal weakness or numbness.    ____________________________________________   PHYSICAL EXAM:  VITAL SIGNS: ED Triage Vitals  Enc Vitals Group     BP 08/27/20 1051 113/75     Pulse Rate 08/27/20 1051 93     Resp 08/27/20 1051 18     Temp 08/27/20 1051 98 F (36.7 C)     Temp Source 08/27/20 1051 Oral     SpO2 08/27/20 1051 91 %     Weight 08/27/20 1052 205 lb 0.4 oz (93 kg)     Height 08/27/20 1052 5\' 7"  (1.702 m)     Head Circumference --      Peak Flow --      Pain Score 08/27/20 1052 0     Pain Loc --      Pain Edu? --      Excl. in GC? --     Constitutional: Alert and oriented.  Moderately dyspneic.  On nonrebreather satting in the mid 90s.  Speaks in short phrases. Eyes: Conjunctivae are normal. Head: Atraumatic. Nose: No congestion/rhinnorhea. Mouth/Throat: Mucous membranes are moist. Neck: No stridor.  Cardiovascular: Normal rate, regular rhythm. Grossly normal heart sounds.  Good peripheral circulation. Respiratory: Moderate tachypnea.  Mild accessory muscle use.  Diminished lung sounds but no noted Rales or wheezing.  No noted crackles.  Frequent dry cough. Gastrointestinal: Soft and nontender. No distention.  Musculoskeletal: No lower extremity tenderness nor edema. Neurologic:  Normal speech and language. No gross focal neurologic deficits are appreciated.  Skin:  Skin is warm, dry and intact. No rash noted. Psychiatric: Mood and affect are normal. Speech and behavior are normal.  ____________________________________________   LABS (all labs ordered are listed, but only abnormal results are displayed)  Labs Reviewed  SARS CORONAVIRUS 2 BY RT PCR (HOSPITAL ORDER, PERFORMED IN [facility]) - Abnormal; Notable for the following components:      Result Value   SARS  Coronavirus 2 POSITIVE (*)    All other components within normal limits  LACTIC ACID, PLASMA - Abnormal; Notable for the following components:   Lactic Acid, Venous 2.1 (*)    All other components within normal limits  COMPREHENSIVE METABOLIC PANEL - Abnormal; Notable for the following components:   CO2 18 (*)    Glucose, Bld 133 (*)    BUN 46 (*)    Creatinine, Ser 2.57 (*)    Total Protein 8.3 (*)    AST 79 (*)    Total Bilirubin 1.5 (*)    GFR calc non Af Amer 26 (*)    GFR calc Af Amer 30 (*)    Anion gap 17 (*)    All other components within normal limits  CBC WITH DIFFERENTIAL/PLATELET - Abnormal; Notable for the following components:   Platelets 97 (*)    Lymphs Abs 0.4 (*)    All other components within normal limits  APTT - Abnormal; Notable for the following components:   aPTT 39 (*)    All other components within normal limits  CULTURE, BLOOD (SINGLE)  URINE CULTURE  PROTIME-INR  BRAIN NATRIURETIC PEPTIDE  PROCALCITONIN  LACTIC ACID, PLASMA  URINALYSIS, COMPLETE (UACMP) WITH MICROSCOPIC   ____________________________________________  EKG  Reviewed interpreted by me at 1125 Heart rate 150 QRS 100 QTc 530.  Nonspecific T wave abnormality.  Mild prolongation of the QT ____________________________________________  RADIOLOGY  DG Chest Portable 1 View  Result Date: 08/27/2020 CLINICAL DATA:  Dyspnea, dry cough for a week. EXAM: PORTABLE CHEST 1 VIEW COMPARISON:  None FINDINGS: Image rotated slightly to the RIGHT. Accounting for this the cardiomediastinal contours and hilar structures are normal. Patchy basilar airspace disease bilaterally with both interstitial and airspace component. No sign of pleural effusion. On limited assessment skeletal structures without acute process. IMPRESSION: Patchy basilar airspace disease bilaterally with both interstitial and airspace component. Findings could be seen in the setting of atypical or multifocal pneumonia. COVID-19  infection could be considered. Electronically Signed   By: Donzetta Kohut M.D.   On: 08/27/2020 11:11    Chest imaging concerning for airspace disease, patchy in nature bilateral. ____________________________________________   PROCEDURES  Procedure(s) performed: None  Procedures  Critical Care performed: Yes, see critical care note(s)  CRITICAL CARE Performed by: Sharyn Creamer   Total critical care time: 35 minutes  Critical care time was exclusive of separately billable procedures and treating other patients.  Critical care was necessary to treat or prevent imminent or life-threatening deterioration.  Critical care was time spent personally by me on the following activities: development of treatment plan with patient and/or surrogate as well as nursing, discussions with consultants, evaluation of patient's response to treatment, examination of patient, obtaining history from patient or surrogate, ordering and performing treatments and interventions, ordering and review of laboratory studies, ordering and review of radiographic studies, pulse oximetry and re-evaluation of patient's condition.   ____ ________________________________________  INITIAL IMPRESSION / ASSESSMENT AND PLAN / ED COURSE  Pertinent labs & imaging results that were available during my care of the patient were reviewed by me and considered in my medical decision making (see chart for details).     Clinical Course as of Aug 27 1332  Wed Aug 27, 2020  1140 Continues to exhibit clear lung sounds, but still mild tachypnea and mild accessory muscle use.  I am quite suspicious this could represent COVID-19.  Will order heated humidified high flow nasal cannula as patient currently saturating about 91 to 94% on nonrebreather.  Patient alert oriented,   [MQ]    Clinical Course User Index [MQ] Sharyn Creamer, MD   Patient's x-ray, clinical history and his positive Covid test seem to be clinically consistent with  Covid pneumonia.  Discussed case and admission with Dr. Dartha Lodge.  Patient tolerating high flow nasal cannula well at this time.  Is been treated with steroid including Solu-Medrol, further treatment and antiviral therapy will be dictated by hospitalist service.  Patient work of breathing improving.  Appears appropriate for hospitalization, tolerating high flow nasal cannula well.  ____________________________________________   FINAL CLINICAL IMPRESSION(S) / ED DIAGNOSES  Final diagnoses:  AKI (acute kidney injury) (HCC)  Hypoxia  COVID-19        Note:  This document was prepared using Dragon voice recognition software and may include unintentional dictation errors       Sharyn Creamer, MD 08/27/20 1335

## 2020-08-27 NOTE — ED Notes (Signed)
Spoke w/ pt's nephew on Phone and provided update w/ permission. Attempted to contact pt's wife, she did not answer. Pt stated he has been in contact w/ her. Pt has phone at bedside.

## 2020-08-27 NOTE — ED Notes (Signed)
Date and time results received: 08/27/20 now (use smartphrase ".now" to insert current time)  Test: lactic acid Critical Value: 2.1  Name of Provider Notified: Dr Fanny Bien  Orders Received? Or Actions Taken?: no new orders at this time

## 2020-08-27 NOTE — ED Notes (Signed)
Respiratory at bedside.

## 2020-08-28 DIAGNOSIS — J1282 Pneumonia due to coronavirus disease 2019: Secondary | ICD-10-CM

## 2020-08-28 DIAGNOSIS — D696 Thrombocytopenia, unspecified: Secondary | ICD-10-CM

## 2020-08-28 DIAGNOSIS — U071 COVID-19: Principal | ICD-10-CM

## 2020-08-28 DIAGNOSIS — N179 Acute kidney failure, unspecified: Secondary | ICD-10-CM | POA: Diagnosis present

## 2020-08-28 DIAGNOSIS — J9601 Acute respiratory failure with hypoxia: Secondary | ICD-10-CM

## 2020-08-28 LAB — COMPREHENSIVE METABOLIC PANEL
ALT: 32 U/L (ref 0–44)
AST: 72 U/L — ABNORMAL HIGH (ref 15–41)
Albumin: 3.1 g/dL — ABNORMAL LOW (ref 3.5–5.0)
Alkaline Phosphatase: 42 U/L (ref 38–126)
Anion gap: 9 (ref 5–15)
BUN: 41 mg/dL — ABNORMAL HIGH (ref 8–23)
CO2: 25 mmol/L (ref 22–32)
Calcium: 8.7 mg/dL — ABNORMAL LOW (ref 8.9–10.3)
Chloride: 104 mmol/L (ref 98–111)
Creatinine, Ser: 1.64 mg/dL — ABNORMAL HIGH (ref 0.61–1.24)
GFR calc Af Amer: 51 mL/min — ABNORMAL LOW (ref >60)
GFR calc non Af Amer: 44 mL/min — ABNORMAL LOW (ref >60)
Glucose, Bld: 158 mg/dL — ABNORMAL HIGH (ref 70–99)
Potassium: 3.8 mmol/L (ref 3.5–5.1)
Sodium: 138 mmol/L (ref 135–145)
Total Bilirubin: 0.6 mg/dL (ref 0.3–1.2)
Total Protein: 7.2 g/dL (ref 6.5–8.1)

## 2020-08-28 LAB — CBC WITH DIFFERENTIAL/PLATELET
Abs Immature Granulocytes: 0.03 10*3/uL (ref 0.00–0.07)
Basophils Absolute: 0 10*3/uL (ref 0.0–0.1)
Basophils Relative: 0 %
Eosinophils Absolute: 0 10*3/uL (ref 0.0–0.5)
Eosinophils Relative: 0 %
HCT: 37 % — ABNORMAL LOW (ref 39.0–52.0)
Hemoglobin: 13.1 g/dL (ref 13.0–17.0)
Immature Granulocytes: 1 %
Lymphocytes Relative: 9 %
Lymphs Abs: 0.4 10*3/uL — ABNORMAL LOW (ref 0.7–4.0)
MCH: 32 pg (ref 26.0–34.0)
MCHC: 35.4 g/dL (ref 30.0–36.0)
MCV: 90.2 fL (ref 80.0–100.0)
Monocytes Absolute: 0.2 10*3/uL (ref 0.1–1.0)
Monocytes Relative: 3 %
Neutro Abs: 4.3 10*3/uL (ref 1.7–7.7)
Neutrophils Relative %: 87 %
Platelets: 87 10*3/uL — ABNORMAL LOW (ref 150–400)
RBC: 4.1 MIL/uL — ABNORMAL LOW (ref 4.22–5.81)
RDW: 13.3 % (ref 11.5–15.5)
WBC: 4.9 10*3/uL (ref 4.0–10.5)
nRBC: 0 % (ref 0.0–0.2)

## 2020-08-28 LAB — HIV ANTIBODY (ROUTINE TESTING W REFLEX): HIV Screen 4th Generation wRfx: NONREACTIVE

## 2020-08-28 LAB — FERRITIN: Ferritin: 1282 ng/mL — ABNORMAL HIGH (ref 24–336)

## 2020-08-28 LAB — PHOSPHORUS: Phosphorus: 3.5 mg/dL (ref 2.5–4.6)

## 2020-08-28 LAB — C-REACTIVE PROTEIN: CRP: 27.6 mg/dL — ABNORMAL HIGH (ref <1.0)

## 2020-08-28 LAB — FIBRIN DERIVATIVES D-DIMER (ARMC ONLY): Fibrin derivatives D-dimer (ARMC): 2943.5 ng/mL (FEU) — ABNORMAL HIGH (ref 0.00–499.00)

## 2020-08-28 LAB — MAGNESIUM: Magnesium: 2.1 mg/dL (ref 1.7–2.4)

## 2020-08-28 MED ORDER — FLUTICASONE PROPIONATE 50 MCG/ACT NA SUSP
2.0000 | Freq: Every day | NASAL | Status: DC
  Administered 2020-08-28 – 2020-09-13 (×17): 2 via NASAL
  Filled 2020-08-28: qty 16

## 2020-08-28 MED ORDER — METHYLPREDNISOLONE SODIUM SUCC 125 MG IJ SOLR
60.0000 mg | Freq: Two times a day (BID) | INTRAMUSCULAR | Status: DC
  Administered 2020-08-28 – 2020-09-03 (×13): 60 mg via INTRAVENOUS
  Filled 2020-08-28 (×13): qty 2

## 2020-08-28 MED ORDER — ALBUTEROL SULFATE HFA 108 (90 BASE) MCG/ACT IN AERS
2.0000 | INHALATION_SPRAY | Freq: Four times a day (QID) | RESPIRATORY_TRACT | Status: DC | PRN
  Administered 2020-08-28 – 2020-09-04 (×4): 2 via RESPIRATORY_TRACT
  Filled 2020-08-28: qty 6.7

## 2020-08-28 MED ORDER — BARICITINIB 2 MG PO TABS
4.0000 mg | ORAL_TABLET | Freq: Every day | ORAL | Status: AC
  Administered 2020-08-28 – 2020-09-10 (×13): 4 mg via ORAL
  Filled 2020-08-28 (×12): qty 2
  Filled 2020-08-28: qty 4
  Filled 2020-08-28: qty 2
  Filled 2020-08-28: qty 4
  Filled 2020-08-28: qty 2

## 2020-08-28 MED ORDER — LORATADINE 10 MG PO TABS
10.0000 mg | ORAL_TABLET | Freq: Every day | ORAL | Status: DC
  Administered 2020-08-28 – 2020-09-12 (×16): 10 mg via ORAL
  Filled 2020-08-28 (×15): qty 1

## 2020-08-28 MED ORDER — IPRATROPIUM-ALBUTEROL 20-100 MCG/ACT IN AERS
2.0000 | INHALATION_SPRAY | Freq: Four times a day (QID) | RESPIRATORY_TRACT | Status: DC
  Administered 2020-08-28 – 2020-09-13 (×56): 2 via RESPIRATORY_TRACT
  Filled 2020-08-28 (×2): qty 4

## 2020-08-28 MED ORDER — PANTOPRAZOLE SODIUM 40 MG PO TBEC
40.0000 mg | DELAYED_RELEASE_TABLET | Freq: Every day | ORAL | Status: DC
  Administered 2020-08-28 – 2020-09-13 (×16): 40 mg via ORAL
  Filled 2020-08-28 (×15): qty 1

## 2020-08-28 NOTE — Progress Notes (Addendum)
PROGRESS NOTE    Randy Ray  ZOX:[id number] DOB: [birth year] DOA: 08/27/2020 PCP: Patient, No Pcp Per    Chief Complaint  Patient presents with  . Shortness of Breath  . Fever    Brief Narrative:  HPI per Dr. Kevan Ny is a 64 y.o. male with medical history no significant past medical history who was brought into the ER by EMS for evaluation of fever, cough and shortness of breath. Patient stated that for over a week he has been experiencing intermittent fever, body aches and a dry cough.  He also complained of poor appetite.  Over the last 2 days he developed shortness of breath which has progressively worsened.  He denies having any chest pains, no palpitations, no diaphoresis, no nausea, no vomiting or any changes in his bowel habits. When EMS arrived patient was found to have room air pulse oximetry of 76% and was placed on a nonrebreather mask with O2 at 10 L improving his pulse oximetry to 93%.  He also received bronchodilator therapy. Patient is unvaccinated and denied having any Covid exposure. Upon arrival to the ER he received Solu-Medrol 125mg  IV x 1 dose since he was wheezing. Presents with chief complaint of breathing, creatinine 2.57, BNP 37.8 lactic acid 2.1, procalcitonin 13.5, white count 4.4 with a left shift. His SARS coronavirus 2 test is positive Chest x-ray reviewed by me shows bibasilar opacities consistent with viral pneumonia Twelve-lead EKG reviewed by me shows sinus tachycardia   ED Course: Patient is a 64 year old African-American male who was brought into the ER by EMS for evaluation of shortness of breath and hypoxia in the field.  Patient is unvaccinated.  He received a dose of Solu-Medrol 125 mg as well as remdesivir in the ER and admitted to the hospital for further evaluation.  Assessment & Plan:   Principal Problem:   Acute hypoxemic respiratory failure due to COVID-19 [facility]) Active Problems:   Pneumonia due to COVID-19 virus    Thrombocytopenia (HCC)   Acute respiratory failure due to COVID-19 (HCC)   AKI (acute kidney injury) (HCC)  #1 acute respiratory failure due to COVID-19 virus Patient presents with symptoms of intermittent fever, dry cough, worsening shortness of breath over the past 2 days, noted to be hypoxic prior to admission with sats of 76% on room air requiring O2 supplementation nonrebreather max at 15 L to keep pulse ox at 98%.  Patient in the ED on high flow nasal cannula.  Patient with some clinical improvement however dyspneic with conversation.  Patient started on IV remdesivir and IV Decadron.  Discontinue Decadron.  Placed on IV Solu-Medrol.  Procalcitonin elevated on admission and as such we will continue IV Rocephin and IV azithromycin for concerns for suspected superimposed bacterial pneumonia.  Fibrin derivatives 2943.50.  INR of 1.1.  CRP elevated at 27.6.  Will place on baricitinib.  Will change to progressive care unit for closer monitoring.  Continue vitamin C, zinc.  Placed on Combivent MDI, Flonase, Claritin, PPI.  Antitussives.  Supportive care.  #2.  Thrombocytopenia Likely secondary to problem #1.  Patient with no bleeding.  Monitor closely on Lovenox.  Follow.  #3.  Acute kidney injury Likely secondary to a prerenal azotemia.  Last creatinine noted on epic from two thousand thirteen was 0.86.  Creatinine on admission 2.57.  Urinalysis nitrite negative, leukocytes negative, one hundred protein.  Renal function improved with hydration.  IV fluids have been saline lock.  Follow.     DVT  prophylaxis: Lovenox Code Status: Full Family Communication: Updated the patient.  No family at bedside. Disposition:   Status is: Inpatient    Dispo: The patient is from: Home              Anticipated d/c is to: Home              Anticipated d/c date is: To be determined              Patient currently with increasing O2 requirements on high flow nasal cannula, on IV remdesivir, IV antibiotics,  not stable for discharge.       Consultants:   None  Procedures:   Chest x-ray 08/27/2020  Antimicrobials:   IV azithromycin 08/27/2020>>>>>  IV Rocephin 08/27/2020>>>>>  IV remdesivir 08/27/2020   Subjective: Feeling better.  Conversational shortness of breath.  Some intermittent coughing.  Watching television.  Denies any significant chest pain.  Objective: Vitals:   08/28/20 1430 08/28/20 1500 08/28/20 1530 08/28/20 1600  BP: 108/75 119/72 116/72 113/71  Pulse: 79 77 80 87  Resp: (!) 25 (!) 25 (!) 28 20  Temp:      TempSrc:      SpO2: 92% (!) 87% 95% 97%  Weight:      Height:        Intake/Output Summary (Last 24 hours) at 08/28/2020 1824 Last data filed at 08/28/2020 1624 Gross per 24 hour  Intake --  Output 750 ml  Net -750 ml   Filed Weights   08/27/20 1052  Weight: 93 kg    Examination:  General exam: Appears calm and comfortable  Respiratory system: Bibasilar coarse BS.  No wheezing.  No crackles.  Some conversational dyspnea.  On high flow nasal cannula. Cardiovascular system: Regular rate rhythm no murmurs rubs or gallops.  No JVD.  No lower extremity edema.  Gastrointestinal system: Abdomen is soft, nontender, nondistended, positive bowel sounds.  No rebound.  No guarding. Central nervous system: Alert and oriented. No focal neurological deficits. Extremities: Symmetric 5 x 5 power. Skin: No rashes, lesions or ulcers Psychiatry: Judgement and insight appear normal. Mood & affect appropriate.     Data Reviewed: I have personally reviewed following labs and imaging studies  CBC: Recent Labs  Lab 08/27/20 1108 08/28/20 0400  WBC 4.4 4.9  NEUTROABS 3.8 4.3  HGB 14.7 13.1  HCT 42.6 37.0*  MCV 92.8 90.2  PLT 97* 87*    Basic Metabolic Panel: Recent Labs  Lab 08/27/20 1108 08/28/20 0400  NA 137 138  K 3.5 3.8  CL 102 104  CO2 18* 25  GLUCOSE 133* 158*  BUN 46* 41*  CREATININE 2.57* 1.64*  CALCIUM 8.9 8.7*  MG  --  2.1  PHOS  --  3.5     GFR: Estimated Creatinine Clearance: 50.8 mL/min (A) (by C-G formula based on SCr of 1.64 mg/dL (H)).  Liver Function Tests: Recent Labs  Lab 08/27/20 1108 08/28/20 0400  AST 79* 72*  ALT 33 32  ALKPHOS 50 42  BILITOT 1.5* 0.6  PROT 8.3* 7.2  ALBUMIN 3.9 3.1*    CBG: No results for input(s): GLUCAP in the last 168 hours.   Recent Results (from the past 240 hour(s))  Blood culture (routine single)     Status: None (Preliminary result)   Collection Time: 08/27/20 11:08 AM   Specimen: BLOOD  Result Value Ref Range Status   Specimen Description BLOOD RIGHT HAND  Final   Special Requests   Final  BOTTLES DRAWN AEROBIC AND ANAEROBIC Blood Culture adequate volume   Culture   Final    NO GROWTH < 24 HOURS Performed at [facility], [address] [place], [place] [address]    Report Status PENDING  Incomplete  SARS Coronavirus 2 by RT PCR (hospital order, performed in [facility] hospital lab) Nasopharyngeal Nasopharyngeal Swab     Status: Abnormal   Collection Time: 08/27/20 11:08 AM   Specimen: Nasopharyngeal Swab  Result Value Ref Range Status   SARS Coronavirus 2 POSITIVE (A) NEGATIVE Final    Comment: RESULT CALLED TO, READ BACK BY AND VERIFIED WITH: MITCH BARKER AT 1310 ON 08/27/2020 [facility]. (NOTE) SARS-CoV-2 target nucleic acids are DETECTED  SARS-CoV-2 RNA is generally detectable in upper respiratory specimens  during the acute phase of infection.  Positive results are indicative  of the presence of the identified virus, but do not rule out bacterial infection or co-infection with other pathogens not detected by the test.  Clinical correlation with patient history and  other diagnostic information is necessary to determine patient infection status.  The expected result is negative.  Fact Sheet for Patients:   BoilerBrush.com.cy   Fact Sheet for Healthcare Providers:   https://pope.com/    This test  is not yet approved or cleared by the [place] FDA and  has been authorized for detection and/or diagnosis of SARS-CoV-2 by FDA under an Emergency Use Authorization (EUA).  This EUA will remain in effect (meaning this  test can be used) for the duration of  the COVID-19 declaration under Section 564(b)(1) of the Act, 21 U.S.C. section 360-bbb-3(b)(1), unless the authorization is terminated or revoked sooner.  Performed at [facility], [address], [place], [place] [address]          Radiology Studies: DG Chest Portable 1 View  Result Date: 08/27/2020 CLINICAL DATA:  Dyspnea, dry cough for a week. EXAM: PORTABLE CHEST 1 VIEW COMPARISON:  None FINDINGS: Image rotated slightly to the RIGHT. Accounting for this the cardiomediastinal contours and hilar structures are normal. Patchy basilar airspace disease bilaterally with both interstitial and airspace component. No sign of pleural effusion. On limited assessment skeletal structures without acute process. IMPRESSION: Patchy basilar airspace disease bilaterally with both interstitial and airspace component. Findings could be seen in the setting of atypical or multifocal pneumonia. COVID-19 infection could be considered. Electronically Signed   By: Donzetta Kohut M.D.   On: 08/27/2020 11:11        Scheduled Meds: . vitamin C  500 mg Oral Daily  . baricitinib  4 mg Oral Daily  . enoxaparin (LOVENOX) injection  40 mg Subcutaneous Q24H  . fluticasone  2 spray Each Nare Daily  . Ipratropium-Albuterol  2 puff Inhalation Q6H  . loratadine  10 mg Oral Daily  . methylPREDNISolone (SOLU-MEDROL) injection  60 mg Intravenous Q12H  . pantoprazole  40 mg Oral Q0600  . sodium chloride flush  3 mL Intravenous Q12H  . zinc sulfate  220 mg Oral Daily   Continuous Infusions: . sodium chloride    . azithromycin Stopped (08/28/20 1624)  . cefTRIAXone (ROCEPHIN)  IV Stopped (08/28/20 1624)  . remdesivir 100 mg in NS 100 mL Stopped  (08/28/20 1055)     LOS: 1 day    Time spent: 40 minutes    Ramiro Harvest, MD [facility]   To contact the attending provider between 7A-7P or the covering provider during after hours 7P-7A, please log into the web site  [email or web address] and access using universal [place] password for that web site. If you do not have the password, please call the hospital operator.  08/28/2020, 6:24 PM

## 2020-08-28 NOTE — Plan of Care (Signed)
  Problem: Education: Goal: Knowledge of [facility] Education information/materials will improve Outcome: Progressing Goal: Emotional status will improve Outcome: Progressing Goal: Mental status will improve Outcome: Progressing Goal: Verbalization of understanding the information provided will improve Outcome: Progressing   Problem: Activity: Goal: Interest or engagement in activities will improve Outcome: Progressing Goal: Sleeping patterns will improve Outcome: Progressing   

## 2020-08-29 LAB — CBC WITH DIFFERENTIAL/PLATELET
Abs Immature Granulocytes: 0.02 10*3/uL (ref 0.00–0.07)
Basophils Absolute: 0 10*3/uL (ref 0.0–0.1)
Basophils Relative: 0 %
Eosinophils Absolute: 0 10*3/uL (ref 0.0–0.5)
Eosinophils Relative: 0 %
HCT: 38.8 % — ABNORMAL LOW (ref 39.0–52.0)
Hemoglobin: 13.1 g/dL (ref 13.0–17.0)
Immature Granulocytes: 1 %
Lymphocytes Relative: 16 %
Lymphs Abs: 0.6 10*3/uL — ABNORMAL LOW (ref 0.7–4.0)
MCH: 31.3 pg (ref 26.0–34.0)
MCHC: 33.8 g/dL (ref 30.0–36.0)
MCV: 92.6 fL (ref 80.0–100.0)
Monocytes Absolute: 0.2 10*3/uL (ref 0.1–1.0)
Monocytes Relative: 6 %
Neutro Abs: 2.8 10*3/uL (ref 1.7–7.7)
Neutrophils Relative %: 77 %
Platelets: 119 10*3/uL — ABNORMAL LOW (ref 150–400)
RBC: 4.19 MIL/uL — ABNORMAL LOW (ref 4.22–5.81)
RDW: 13.1 % (ref 11.5–15.5)
WBC: 3.6 10*3/uL — ABNORMAL LOW (ref 4.0–10.5)
nRBC: 0 % (ref 0.0–0.2)

## 2020-08-29 LAB — COMPREHENSIVE METABOLIC PANEL
ALT: 30 U/L (ref 0–44)
AST: 58 U/L — ABNORMAL HIGH (ref 15–41)
Albumin: 3.3 g/dL — ABNORMAL LOW (ref 3.5–5.0)
Alkaline Phosphatase: 43 U/L (ref 38–126)
Anion gap: 11 (ref 5–15)
BUN: 44 mg/dL — ABNORMAL HIGH (ref 8–23)
CO2: 23 mmol/L (ref 22–32)
Calcium: 9.2 mg/dL (ref 8.9–10.3)
Chloride: 106 mmol/L (ref 98–111)
Creatinine, Ser: 1.37 mg/dL — ABNORMAL HIGH (ref 0.61–1.24)
GFR calc Af Amer: >60 mL/min (ref >60)
GFR calc non Af Amer: 55 mL/min — ABNORMAL LOW (ref >60)
Glucose, Bld: 176 mg/dL — ABNORMAL HIGH (ref 70–99)
Potassium: 4.3 mmol/L (ref 3.5–5.1)
Sodium: 140 mmol/L (ref 135–145)
Total Bilirubin: 0.7 mg/dL (ref 0.3–1.2)
Total Protein: 7.3 g/dL (ref 6.5–8.1)

## 2020-08-29 LAB — URINE CULTURE

## 2020-08-29 LAB — PROCALCITONIN: Procalcitonin: 7.76 ng/mL

## 2020-08-29 LAB — FIBRIN DERIVATIVES D-DIMER (ARMC ONLY): Fibrin derivatives D-dimer (ARMC): 2198.97 ng/mL (FEU) — ABNORMAL HIGH (ref 0.00–499.00)

## 2020-08-29 LAB — MAGNESIUM: Magnesium: 2.6 mg/dL — ABNORMAL HIGH (ref 1.7–2.4)

## 2020-08-29 LAB — C-REACTIVE PROTEIN: CRP: 18.5 mg/dL — ABNORMAL HIGH (ref <1.0)

## 2020-08-29 LAB — PHOSPHORUS: Phosphorus: 3.7 mg/dL (ref 2.5–4.6)

## 2020-08-29 LAB — FERRITIN: Ferritin: 1682 ng/mL — ABNORMAL HIGH (ref 24–336)

## 2020-08-29 NOTE — Progress Notes (Signed)
PROGRESS NOTE    Randy Ray  ZOX:[id number] DOB: 12-Jun-1956 DOA: 08/27/2020 PCP: Patient, No Pcp Per    Chief Complaint  Patient presents with  . Shortness of Breath  . Fever    Brief Narrative:  HPI per Dr. Kevan Ny is a 64 y.o. male with medical history no significant past medical history who was brought into the ER by EMS for evaluation of fever, cough and shortness of breath. Patient stated that for over a week he has been experiencing intermittent fever, body aches and a dry cough.  He also complained of poor appetite.  Over the last 2 days he developed shortness of breath which has progressively worsened.  He denies having any chest pains, no palpitations, no diaphoresis, no nausea, no vomiting or any changes in his bowel habits. When EMS arrived patient was found to have room air pulse oximetry of 76% and was placed on a nonrebreather mask with O2 at 10 L improving his pulse oximetry to 93%.  He also received bronchodilator therapy. Patient is unvaccinated and denied having any Covid exposure. Upon arrival to the ER he received Solu-Medrol 125mg  IV x 1 dose since he was wheezing. Presents with chief complaint of breathing, creatinine 2.57, BNP 37.8 lactic acid 2.1, procalcitonin 13.5, white count 4.4 with a left shift. His SARS coronavirus 2 test is positive Chest x-ray reviewed by me shows bibasilar opacities consistent with viral pneumonia Twelve-lead EKG reviewed by me shows sinus tachycardia   ED Course: Patient is a 65 year old African-American male who was brought into the ER by EMS for evaluation of shortness of breath and hypoxia in the field.  Patient is unvaccinated.  He received a dose of Solu-Medrol 125 mg as well as remdesivir in the ER and admitted to the hospital for further evaluation.  Assessment & Plan:   Principal Problem:   Acute hypoxemic respiratory failure due to COVID-19 [facility]) Active Problems:   Pneumonia due to COVID-19 virus    Thrombocytopenia (HCC)   Acute respiratory failure due to COVID-19 (HCC)   AKI (acute kidney injury) (HCC)  #1 acute respiratory failure due to COVID-19 virus Patient presented with symptoms of intermittent fever, dry cough, worsening shortness of breath over the past 2 days, noted to be hypoxic prior to admission with sats of 76% on room air requiring O2 supplementation nonrebreather max at 15 L to keep pulse ox at 98%.  Patient in the ED on high flow nasal cannula.  Patient with some clinical improvement however dyspneic with conversation.  Patient started on IV remdesivir and IV Decadron.  Discontinue Decadron.  Placed on IV Solu-Medrol.  Procalcitonin elevated on admission and trending down, as such we will continue IV Rocephin and IV azithromycin for concerns for suspected superimposed bacterial pneumonia.  Fibrin derivatives at 2198 from 2943.50.  INR of 1.1.  CRP elevated on admission however trending down and currently at 18.5 from 27.6.  Continue baricitinib, IV remdesivir, vitamin C, zinc, Combivent MDI, Flonase, Claritin, PPI, antitussives Supportive care.   #2.  Thrombocytopenia Likely secondary to problem #1.  No overt bleeding.  Thrombocytopenia improving.  Follow.   #3.  Acute kidney injury Likely secondary to a prerenal azotemia.  Last creatinine noted on epic from two thousand thirteen was 0.86.  Creatinine on admission 2.57.  Urinalysis nitrite negative, leukocytes negative, one hundred protein.  Renal function improved with hydration.  IV fluids have been saline lock.  Follow.     DVT prophylaxis: Lovenox Code Status:  Full Family Communication: Updated the patient.  No family at bedside. Disposition:   Status is: Inpatient    Dispo: The patient is from: Home              Anticipated d/c is to: Home              Anticipated d/c date is: To be determined              Patient currently with increasing O2 requirements on high flow nasal cannula, on IV remdesivir, IV  antibiotics, not stable for discharge.       Consultants:   None  Procedures:   Chest x-ray 08/27/2020  Antimicrobials:   IV azithromycin 08/27/2020>>>>>  IV Rocephin 08/27/2020>>>>>  IV remdesivir 08/27/2020   Subjective: Sitting up on gurney watching television.  Intermittent coughing.  Denies any significant chest pain.  Few shortness of breath is improving daily.   Objective: Vitals:   08/28/20 1953 08/28/20 2255 08/29/20 0513 08/29/20 0700  BP: 116/70  120/70 (!) 126/93  Pulse: 72 66 77 65  Resp: 14 20 20  (!) 33  Temp: 98 F (36.7 C)  98.8 F (37.1 C) 97.9 F (36.6 C)  TempSrc: Oral  Oral Oral  SpO2: 98% 94% 95% 91%  Weight:      Height:        Intake/Output Summary (Last 24 hours) at 08/29/2020 0937 Last data filed at 08/28/2020 1624 Gross per 24 hour  Intake --  Output 400 ml  Net -400 ml   Filed Weights   08/27/20 1052  Weight: 93 kg    Examination:  General exam: Appears calm and comfortable  Respiratory system: Some coarse breath sounds in the bases.  Fair air movement.  No wheezing.  No crackles.  No use of accessory muscles of respiration.  On high flow nasal cannula.  Cardiovascular system: RRR no murmurs rubs or gallops.  No JVD.  No lower extremity edema.  Gastrointestinal system: Abdomen is nontender, nondistended, soft, positive bowel sounds.  No rebound.  No guarding.  Central nervous system: Alert and oriented. No focal neurological deficits. Extremities: Symmetric 5 x 5 power. Skin: No rashes, lesions or ulcers Psychiatry: Judgement and insight appear normal. Mood & affect appropriate.     Data Reviewed: I have personally reviewed following labs and imaging studies  CBC: Recent Labs  Lab 08/27/20 1108 08/28/20 0400 08/29/20 0725  WBC 4.4 4.9 3.6*  NEUTROABS 3.8 4.3 2.8  HGB 14.7 13.1 13.1  HCT 42.6 37.0* 38.8*  MCV 92.8 90.2 92.6  PLT 97* 87* 119*    Basic Metabolic Panel: Recent Labs  Lab 08/27/20 1108 08/28/20 0400  08/29/20 0725  NA 137 138 140  K 3.5 3.8 4.3  CL 102 104 106  CO2 18* 25 23  GLUCOSE 133* 158* 176*  BUN 46* 41* 44*  CREATININE 2.57* 1.64* 1.37*  CALCIUM 8.9 8.7* 9.2  MG  --  2.1 2.6*  PHOS  --  3.5 3.7    GFR: Estimated Creatinine Clearance: 60.8 mL/min (A) (by C-G formula based on SCr of 1.37 mg/dL (H)).  Liver Function Tests: Recent Labs  Lab 08/27/20 1108 08/28/20 0400 08/29/20 0725  AST 79* 72* 58*  ALT 33 32 30  ALKPHOS 50 42 43  BILITOT 1.5* 0.6 0.7  PROT 8.3* 7.2 7.3  ALBUMIN 3.9 3.1* 3.3*    CBG: No results for input(s): GLUCAP in the last 168 hours.   Recent Results (from the past 240 hour(s))  Blood culture (routine single)     Status: None (Preliminary result)   Collection Time: 08/27/20 11:08 AM   Specimen: BLOOD  Result Value Ref Range Status   Specimen Description BLOOD RIGHT HAND  Final   Special Requests   Final    BOTTLES DRAWN AEROBIC AND ANAEROBIC Blood Culture adequate volume   Culture   Final    NO GROWTH 2 DAYS Performed at [facility], [address] [place], [place] [address]    Report Status PENDING  Incomplete  SARS Coronavirus 2 by RT PCR (hospital order, performed in [facility] hospital lab) Nasopharyngeal Nasopharyngeal Swab     Status: Abnormal   Collection Time: 08/27/20 11:08 AM   Specimen: Nasopharyngeal Swab  Result Value Ref Range Status   SARS Coronavirus 2 POSITIVE (A) NEGATIVE Final    Comment: RESULT CALLED TO, READ BACK BY AND VERIFIED WITH: MITCH BARKER AT 1310 ON 08/27/2020 [facility]. (NOTE) SARS-CoV-2 target nucleic acids are DETECTED  SARS-CoV-2 RNA is generally detectable in upper respiratory specimens  during the acute phase of infection.  Positive results are indicative  of the presence of the identified virus, but do not rule out bacterial infection or co-infection with other pathogens not detected by the test.  Clinical correlation with patient history and  other diagnostic information is  necessary to determine patient infection status.  The expected result is negative.  Fact Sheet for Patients:   BoilerBrush.com.cy   Fact Sheet for Healthcare Providers:   https://pope.com/    This test is not yet approved or cleared by the [place] FDA and  has been authorized for detection and/or diagnosis of SARS-CoV-2 by FDA under an Emergency Use Authorization (EUA).  This EUA will remain in effect (meaning this  test can be used) for the duration of  the COVID-19 declaration under Section 564(b)(1) of the Act, 21 U.S.C. section 360-bbb-3(b)(1), unless the authorization is terminated or revoked sooner.  Performed at [facility], [address] [place], [place] [address]   Urine culture     Status: Abnormal   Collection Time: 08/27/20  3:52 PM   Specimen: In/Out Cath Urine  Result Value Ref Range Status   Specimen Description   Final    IN/OUT CATH URINE Performed at [facility], [address] [place], [place] [address]    Special Requests   Final    NONE Performed at [facility], [address] [place], [place] [address]    Culture MULTIPLE SPECIES PRESENT, SUGGEST RECOLLECTION (A)  Final   Report Status 08/29/2020 FINAL  Final         Radiology Studies: DG Chest Portable 1 View  Result Date: 08/27/2020 CLINICAL DATA:  Dyspnea, dry cough for a week. EXAM: PORTABLE CHEST 1 VIEW COMPARISON:  None FINDINGS: Image rotated slightly to the RIGHT. Accounting for this the cardiomediastinal contours and hilar structures are normal. Patchy basilar airspace disease bilaterally with both interstitial and airspace component. No sign of pleural effusion. On limited assessment skeletal structures without acute process. IMPRESSION: Patchy basilar airspace disease bilaterally with both interstitial and airspace component. Findings could be seen in the setting of atypical or multifocal pneumonia.  COVID-19 infection could be considered. Electronically Signed   By: Donzetta Kohut M.D.   On: 08/27/2020 11:11        Scheduled Meds: . vitamin C  500 mg Oral Daily  . baricitinib  4 mg Oral Daily  . enoxaparin (LOVENOX) injection  40 mg  Subcutaneous Q24H  . fluticasone  2 spray Each Nare Daily  . Ipratropium-Albuterol  2 puff Inhalation Q6H  . loratadine  10 mg Oral Daily  . methylPREDNISolone (SOLU-MEDROL) injection  60 mg Intravenous Q12H  . pantoprazole  40 mg Oral Q0600  . sodium chloride flush  3 mL Intravenous Q12H  . zinc sulfate  220 mg Oral Daily   Continuous Infusions: . sodium chloride    . azithromycin Stopped (08/28/20 1624)  . cefTRIAXone (ROCEPHIN)  IV Stopped (08/28/20 1624)  . remdesivir 100 mg in NS 100 mL Stopped (08/28/20 1055)     LOS: 2 days    Time spent: 40 minutes    Ramiro Harvest, MD [facility]   To contact the attending provider between 7A-7P or the covering provider during after hours 7P-7A, please log into the web site [email or web address] and access using universal [place] password for that web site. If you do not have the password, please call the hospital operator.  08/29/2020, 9:37 AM

## 2020-08-29 NOTE — Progress Notes (Signed)
Patient remains on heated high flow cannula tolerating well. Flutter and incentive initiated.

## 2020-08-30 LAB — CBC WITH DIFFERENTIAL/PLATELET
Abs Immature Granulocytes: 0.02 10*3/uL (ref 0.00–0.07)
Basophils Absolute: 0 10*3/uL (ref 0.0–0.1)
Basophils Relative: 0 %
Eosinophils Absolute: 0 10*3/uL (ref 0.0–0.5)
Eosinophils Relative: 0 %
HCT: 37.1 % — ABNORMAL LOW (ref 39.0–52.0)
Hemoglobin: 12.7 g/dL — ABNORMAL LOW (ref 13.0–17.0)
Immature Granulocytes: 1 %
Lymphocytes Relative: 19 %
Lymphs Abs: 0.5 10*3/uL — ABNORMAL LOW (ref 0.7–4.0)
MCH: 31.8 pg (ref 26.0–34.0)
MCHC: 34.2 g/dL (ref 30.0–36.0)
MCV: 92.8 fL (ref 80.0–100.0)
Monocytes Absolute: 0.2 10*3/uL (ref 0.1–1.0)
Monocytes Relative: 8 %
Neutro Abs: 1.8 10*3/uL (ref 1.7–7.7)
Neutrophils Relative %: 72 %
Platelets: 130 10*3/uL — ABNORMAL LOW (ref 150–400)
RBC: 4 MIL/uL — ABNORMAL LOW (ref 4.22–5.81)
RDW: 13.1 % (ref 11.5–15.5)
Smear Review: NORMAL
WBC: 2.5 10*3/uL — ABNORMAL LOW (ref 4.0–10.5)
nRBC: 0 % (ref 0.0–0.2)

## 2020-08-30 LAB — COMPREHENSIVE METABOLIC PANEL
ALT: 31 U/L (ref 0–44)
AST: 47 U/L — ABNORMAL HIGH (ref 15–41)
Albumin: 2.9 g/dL — ABNORMAL LOW (ref 3.5–5.0)
Alkaline Phosphatase: 39 U/L (ref 38–126)
Anion gap: 9 (ref 5–15)
BUN: 41 mg/dL — ABNORMAL HIGH (ref 8–23)
CO2: 24 mmol/L (ref 22–32)
Calcium: 8.9 mg/dL (ref 8.9–10.3)
Chloride: 105 mmol/L (ref 98–111)
Creatinine, Ser: 1.24 mg/dL (ref 0.61–1.24)
GFR calc Af Amer: >60 mL/min (ref >60)
GFR calc non Af Amer: >60 mL/min (ref >60)
Glucose, Bld: 184 mg/dL — ABNORMAL HIGH (ref 70–99)
Potassium: 4.5 mmol/L (ref 3.5–5.1)
Sodium: 138 mmol/L (ref 135–145)
Total Bilirubin: 0.7 mg/dL (ref 0.3–1.2)
Total Protein: 7 g/dL (ref 6.5–8.1)

## 2020-08-30 LAB — C-REACTIVE PROTEIN: CRP: 8.2 mg/dL — ABNORMAL HIGH (ref <1.0)

## 2020-08-30 LAB — PROCALCITONIN: Procalcitonin: 3.31 ng/mL

## 2020-08-30 LAB — FERRITIN: Ferritin: 1384 ng/mL — ABNORMAL HIGH (ref 24–336)

## 2020-08-30 LAB — MAGNESIUM: Magnesium: 2.3 mg/dL (ref 1.7–2.4)

## 2020-08-30 LAB — FIBRIN DERIVATIVES D-DIMER (ARMC ONLY): Fibrin derivatives D-dimer (ARMC): 1557.32 ng/mL (FEU) — ABNORMAL HIGH (ref 0.00–499.00)

## 2020-08-30 LAB — PHOSPHORUS: Phosphorus: 4.3 mg/dL (ref 2.5–4.6)

## 2020-08-30 MED ORDER — AZITHROMYCIN 250 MG PO TABS
500.0000 mg | ORAL_TABLET | Freq: Every day | ORAL | Status: DC
  Administered 2020-08-30 – 2020-09-02 (×4): 500 mg via ORAL
  Filled 2020-08-30 (×4): qty 2

## 2020-08-30 NOTE — Progress Notes (Addendum)
PROGRESS NOTE    Randy BEHANNA  XTG:[id number] DOB: 10/29/1956 DOA: 08/27/2020 PCP: Patient, No Pcp Per    Chief Complaint  Patient presents with  . Shortness of Breath  . Fever    Brief Narrative:  HPI per Dr. Kevan Ny is a 64 y.o. male with medical history no significant past medical history who was brought into the ER by EMS for evaluation of fever, cough and shortness of breath. Patient stated that for over a week he has been experiencing intermittent fever, body aches and a dry cough.  He also complained of poor appetite.  Over the last 2 days he developed shortness of breath which has progressively worsened.  He denies having any chest pains, no palpitations, no diaphoresis, no nausea, no vomiting or any changes in his bowel habits. When EMS arrived patient was found to have room air pulse oximetry of 76% and was placed on a nonrebreather mask with O2 at 10 L improving his pulse oximetry to 93%.  He also received bronchodilator therapy. Patient is unvaccinated and denied having any Covid exposure. Upon arrival to the ER he received Solu-Medrol 125mg  IV x 1 dose since he was wheezing. Presents with chief complaint of breathing, creatinine 2.57, BNP 37.8 lactic acid 2.1, procalcitonin 13.5, white count 4.4 with a left shift. His SARS coronavirus 2 test is positive Chest x-ray reviewed by me shows bibasilar opacities consistent with viral pneumonia Twelve-lead EKG reviewed by me shows sinus tachycardia   ED Course: Patient is a 64 year old African-American male who was brought into the ER by EMS for evaluation of shortness of breath and hypoxia in the field.  Patient is unvaccinated.  He received a dose of Solu-Medrol 125 mg as well as remdesivir in the ER and admitted to the hospital for further evaluation.  Assessment & Plan:   Principal Problem:   Acute hypoxemic respiratory failure due to COVID-19 [facility]) Active Problems:   Pneumonia due to COVID-19 virus    Thrombocytopenia (HCC)   Acute respiratory failure due to COVID-19 (HCC)   AKI (acute kidney injury) (HCC)  #1 acute respiratory failure due to COVID-19 virus Patient presented with symptoms of intermittent fever, dry cough, worsening shortness of breath over the past 2 days, noted to be hypoxic prior to admission with sats of 76% on room air requiring O2 supplementation nonrebreather max at 15 L to keep pulse ox at 98%.  Patient currently on high flow nasal cannula with O2 flow rate of 31 L/min.  Patient improving slowly clinically.  Patient was started on IV remdesivir and IV Decadron.  IV Decadron discontinued and patient placed on IV Solu-Medrol.   Procalcitonin elevated on admission and trending down, as such we will continue IV Rocephin and IV azithromycin for concerns for suspected superimposed bacterial pneumonia.  Change IV azithromycin to oral azithromycin.  Fibrin derivatives at 1557 from 2198 from 2943.50.  INR of 1.1.  CRP elevated on admission however trending down and currently at 8.2 from 18.5 from 27.6.  Continue baricitinib, IV remdesivir, IV Solu-Medrol, vitamin C, zinc, Combivent MDI, Flonase, Claritin, PPI, antitussives Supportive care.   #2.  Thrombocytopenia Likely secondary to problem #1.  No overt bleeding.  Thrombocytopenia improving.  Follow.   #3.  Acute kidney injury Likely secondary to a prerenal azotemia.  Last creatinine noted on epic from two thousand thirteen was 0.86.  Creatinine on admission 2.57.  Urinalysis nitrite negative, leukocytes negative, one hundred protein.  UOP of 1.2 L over the  past 24 hours.  Renal function improving daily.  Creatinine currently at 1.24.  IV fluids have been saline lock.  Follow.      DVT prophylaxis: Lovenox Code Status: Full Family Communication: Updated the patient.  Tried calling Contact Arline Asp however no answer.  Disposition:   Status is: Inpatient    Dispo: The patient is from: Home              Anticipated d/c  is to: Home              Anticipated d/c date is: To be determined              Patient currently with high O2 requirements on high flow nasal cannula at 31 L/min, on IV remdesivir, IV antibiotics, not stable for discharge.       Consultants:   None  Procedures:   Chest x-ray 08/27/2020  Antimicrobials:   IV azithromycin 08/27/2020>>>>> oral azithromycin 08/30/2020>>>  IV Rocephin 08/27/2020>>>>>  IV remdesivir 08/27/2020>>>>>  Baricitinib 08/28/2020>>>>> 09/11/2020   Subjective: Patient laying prone.  Few shortness of breath is improving.  Denies any significant chest pain.  On high flow nasal cannula with sats of 94% with a flow rate of 31 L/min.    Objective: Vitals:   08/30/20 0121 08/30/20 0125 08/30/20 0505 08/30/20 0800  BP:  115/77 121/80 103/88  Pulse:  63 65   Resp:  (!) 31 (!) 36 (!) 30  Temp: 98.3 F (36.8 C)  97.8 F (36.6 C) 97.7 F (36.5 C)  TempSrc: Oral  Oral   SpO2:  91% 94%   Weight:   81.2 kg   Height:        Intake/Output Summary (Last 24 hours) at 08/30/2020 0947 Last data filed at 08/30/2020 0630 Gross per 24 hour  Intake 480.5 ml  Output 1200 ml  Net -719.5 ml   Filed Weights   08/27/20 1052 08/29/20 1621 08/30/20 0505  Weight: 93 kg 87 kg 81.2 kg    Examination:  General exam: NAD.  Prone position Respiratory system: Decreased coarse breath sounds in the bases.  Fair air movement.  No wheezing.  No crackles.  On high flow nasal cannula.  Cardiovascular system: Regular rate rhythm no murmurs rubs or gallops.  No JVD.  No lower extremity edema. Gastrointestinal system: Abdomen is soft, nontender, nondistended, positive bowel sounds.  No rebound.  No guarding.   Central nervous system: Alert and oriented. No focal neurological deficits. Extremities: Symmetric 5 x 5 power. Skin: No rashes, lesions or ulcers Psychiatry: Judgement and insight appear normal. Mood & affect appropriate.     Data Reviewed: I have personally reviewed following labs  and imaging studies  CBC: Recent Labs  Lab 08/27/20 1108 08/28/20 0400 08/29/20 0725 08/30/20 0607  WBC 4.4 4.9 3.6* 2.5*  NEUTROABS 3.8 4.3 2.8 1.8  HGB 14.7 13.1 13.1 12.7*  HCT 42.6 37.0* 38.8* 37.1*  MCV 92.8 90.2 92.6 92.8  PLT 97* 87* 119* 130*    Basic Metabolic Panel: Recent Labs  Lab 08/27/20 1108 08/28/20 0400 08/29/20 0725 08/30/20 0607  NA 137 138 140 138  K 3.5 3.8 4.3 4.5  CL 102 104 106 105  CO2 18* 25 23 24   GLUCOSE 133* 158* 176* 184*  BUN 46* 41* 44* 41*  CREATININE 2.57* 1.64* 1.37* 1.24  CALCIUM 8.9 8.7* 9.2 8.9  MG  --  2.1 2.6* 2.3  PHOS  --  3.5 3.7 4.3    GFR: Estimated  Creatinine Clearance: 63 mL/min (by C-G formula based on SCr of 1.24 mg/dL).  Liver Function Tests: Recent Labs  Lab 08/27/20 1108 08/28/20 0400 08/29/20 0725 08/30/20 0607  AST 79* 72* 58* 47*  ALT 33 32 30 31  ALKPHOS 50 42 43 39  BILITOT 1.5* 0.6 0.7 0.7  PROT 8.3* 7.2 7.3 7.0  ALBUMIN 3.9 3.1* 3.3* 2.9*    CBG: No results for input(s): GLUCAP in the last 168 hours.   Recent Results (from the past 240 hour(s))  Blood culture (routine single)     Status: None (Preliminary result)   Collection Time: 08/27/20 11:08 AM   Specimen: BLOOD  Result Value Ref Range Status   Specimen Description BLOOD RIGHT HAND  Final   Special Requests   Final    BOTTLES DRAWN AEROBIC AND ANAEROBIC Blood Culture adequate volume   Culture   Final    NO GROWTH 3 DAYS Performed at [facility], [address], [place], [place] [address]    Report Status PENDING  Incomplete  SARS Coronavirus 2 by RT PCR (hospital order, performed in [facility] hospital lab) Nasopharyngeal Nasopharyngeal Swab     Status: Abnormal   Collection Time: 08/27/20 11:08 AM   Specimen: Nasopharyngeal Swab  Result Value Ref Range Status   SARS Coronavirus 2 POSITIVE (A) NEGATIVE Final    Comment: RESULT CALLED TO, READ BACK BY AND VERIFIED WITH: MITCH BARKER AT 1310 ON 08/27/2020  [facility]. (NOTE) SARS-CoV-2 target nucleic acids are DETECTED  SARS-CoV-2 RNA is generally detectable in upper respiratory specimens  during the acute phase of infection.  Positive results are indicative  of the presence of the identified virus, but do not rule out bacterial infection or co-infection with other pathogens not detected by the test.  Clinical correlation with patient history and  other diagnostic information is necessary to determine patient infection status.  The expected result is negative.  Fact Sheet for Patients:   BoilerBrush.com.cy   Fact Sheet for Healthcare Providers:   https://pope.com/    This test is not yet approved or cleared by the [place] FDA and  has been authorized for detection and/or diagnosis of SARS-CoV-2 by FDA under an Emergency Use Authorization (EUA).  This EUA will remain in effect (meaning this  test can be used) for the duration of  the COVID-19 declaration under Section 564(b)(1) of the Act, 21 U.S.C. section 360-bbb-3(b)(1), unless the authorization is terminated or revoked sooner.  Performed at [facility], [address], [place], [place] [address]   Urine culture     Status: Abnormal   Collection Time: 08/27/20  3:52 PM   Specimen: In/Out Cath Urine  Result Value Ref Range Status   Specimen Description   Final    IN/OUT CATH URINE Performed at [facility], [address] [place], [place] [address]    Special Requests   Final    NONE Performed at [facility], [address] [place], [place] [address]    Culture MULTIPLE SPECIES PRESENT, SUGGEST RECOLLECTION (A)  Final   Report Status 08/29/2020 FINAL  Final         Radiology Studies: No results found.      Scheduled Meds: . vitamin C  500 mg Oral Daily  . azithromycin  500 mg Oral Daily  . baricitinib  4 mg Oral Daily  . enoxaparin (LOVENOX) injection  40 mg Subcutaneous Q24H  .  fluticasone  2 spray Each Nare Daily  .  Ipratropium-Albuterol  2 puff Inhalation Q6H  . loratadine  10 mg Oral Daily  . methylPREDNISolone (SOLU-MEDROL) injection  60 mg Intravenous Q12H  . pantoprazole  40 mg Oral Q0600  . sodium chloride flush  3 mL Intravenous Q12H  . zinc sulfate  220 mg Oral Daily   Continuous Infusions: . sodium chloride    . cefTRIAXone (ROCEPHIN)  IV Stopped (08/29/20 1545)  . remdesivir 100 mg in NS 100 mL 100 mg (08/30/20 0927)     LOS: 3 days    Time spent: 40 minutes    Ramiro Harvest, MD [facility]   To contact the attending provider between 7A-7P or the covering provider during after hours 7P-7A, please log into the web site [email or web address] and access using universal [place] password for that web site. If you do not have the password, please call the hospital operator.  08/30/2020, 9:47 AM

## 2020-08-31 LAB — CBC WITH DIFFERENTIAL/PLATELET
Abs Immature Granulocytes: 0.02 10*3/uL (ref 0.00–0.07)
Basophils Absolute: 0 10*3/uL (ref 0.0–0.1)
Basophils Relative: 0 %
Eosinophils Absolute: 0 10*3/uL (ref 0.0–0.5)
Eosinophils Relative: 0 %
HCT: 37.5 % — ABNORMAL LOW (ref 39.0–52.0)
Hemoglobin: 12.9 g/dL — ABNORMAL LOW (ref 13.0–17.0)
Immature Granulocytes: 1 %
Lymphocytes Relative: 18 %
Lymphs Abs: 0.4 10*3/uL — ABNORMAL LOW (ref 0.7–4.0)
MCH: 31.8 pg (ref 26.0–34.0)
MCHC: 34.4 g/dL (ref 30.0–36.0)
MCV: 92.4 fL (ref 80.0–100.0)
Monocytes Absolute: 0.2 10*3/uL (ref 0.1–1.0)
Monocytes Relative: 9 %
Neutro Abs: 1.6 10*3/uL — ABNORMAL LOW (ref 1.7–7.7)
Neutrophils Relative %: 72 %
Platelets: 157 10*3/uL (ref 150–400)
RBC: 4.06 MIL/uL — ABNORMAL LOW (ref 4.22–5.81)
RDW: 12.9 % (ref 11.5–15.5)
Smear Review: NORMAL
WBC: 2.2 10*3/uL — ABNORMAL LOW (ref 4.0–10.5)
nRBC: 0 % (ref 0.0–0.2)

## 2020-08-31 LAB — COMPREHENSIVE METABOLIC PANEL
ALT: 31 U/L (ref 0–44)
AST: 34 U/L (ref 15–41)
Albumin: 2.9 g/dL — ABNORMAL LOW (ref 3.5–5.0)
Alkaline Phosphatase: 39 U/L (ref 38–126)
Anion gap: 10 (ref 5–15)
BUN: 35 mg/dL — ABNORMAL HIGH (ref 8–23)
CO2: 24 mmol/L (ref 22–32)
Calcium: 8.9 mg/dL (ref 8.9–10.3)
Chloride: 106 mmol/L (ref 98–111)
Creatinine, Ser: 1.14 mg/dL (ref 0.61–1.24)
GFR calc Af Amer: >60 mL/min (ref >60)
GFR calc non Af Amer: >60 mL/min (ref >60)
Glucose, Bld: 189 mg/dL — ABNORMAL HIGH (ref 70–99)
Potassium: 4.6 mmol/L (ref 3.5–5.1)
Sodium: 140 mmol/L (ref 135–145)
Total Bilirubin: 0.6 mg/dL (ref 0.3–1.2)
Total Protein: 6.8 g/dL (ref 6.5–8.1)

## 2020-08-31 LAB — PHOSPHORUS: Phosphorus: 3.9 mg/dL (ref 2.5–4.6)

## 2020-08-31 LAB — MAGNESIUM: Magnesium: 2.4 mg/dL (ref 1.7–2.4)

## 2020-08-31 LAB — FERRITIN: Ferritin: 814 ng/mL — ABNORMAL HIGH (ref 24–336)

## 2020-08-31 LAB — FIBRIN DERIVATIVES D-DIMER (ARMC ONLY): Fibrin derivatives D-dimer (ARMC): 1100.13 ng/mL (FEU) — ABNORMAL HIGH (ref 0.00–499.00)

## 2020-08-31 LAB — C-REACTIVE PROTEIN: CRP: 5.1 mg/dL — ABNORMAL HIGH (ref <1.0)

## 2020-08-31 MED ORDER — FUROSEMIDE 10 MG/ML IJ SOLN
40.0000 mg | Freq: Once | INTRAMUSCULAR | Status: AC
  Administered 2020-08-31: 40 mg via INTRAVENOUS
  Filled 2020-08-31: qty 4

## 2020-08-31 NOTE — Progress Notes (Signed)
PROGRESS NOTE    Randy Ray  DJM:[id number] DOB: [birth year] DOA: 08/27/2020 PCP: Patient, No Pcp Per    Chief Complaint  Patient presents with   Shortness of Breath   Fever    Brief Narrative:  HPI per Dr. Kevan Ny is a 64 y.o. male with medical history no significant past medical history who was brought into the ER by EMS for evaluation of fever, cough and shortness of breath. Patient stated that for over a week he has been experiencing intermittent fever, body aches and a dry cough.  He also complained of poor appetite.  Over the last 2 days he developed shortness of breath which has progressively worsened.  He denies having any chest pains, no palpitations, no diaphoresis, no nausea, no vomiting or any changes in his bowel habits. When EMS arrived patient was found to have room air pulse oximetry of 76% and was placed on a nonrebreather mask with O2 at 10 L improving his pulse oximetry to 93%.  He also received bronchodilator therapy. Patient is unvaccinated and denied having any Covid exposure. Upon arrival to the ER he received Solu-Medrol 125mg  IV x 1 dose since he was wheezing. Presents with chief complaint of breathing, creatinine 2.57, BNP 37.8 lactic acid 2.1, procalcitonin 13.5, white count 4.4 with a left shift. His SARS coronavirus 2 test is positive Chest x-ray reviewed by me shows bibasilar opacities consistent with viral pneumonia Twelve-lead EKG reviewed by me shows sinus tachycardia   ED Course: Patient is a 64 year old African-American male who was brought into the ER by EMS for evaluation of shortness of breath and hypoxia in the field.  Patient is unvaccinated.  He received a dose of Solu-Medrol 125 mg as well as remdesivir in the ER and admitted to the hospital for further evaluation.  Assessment & Plan:   Principal Problem:   Acute hypoxemic respiratory failure due to COVID-19 [facility]) Active Problems:   Pneumonia due to COVID-19 virus    Thrombocytopenia (HCC)   Acute respiratory failure due to COVID-19 (HCC)   AKI (acute kidney injury) (HCC)  #1 acute respiratory failure due to COVID-19 virus Patient presented with symptoms of intermittent fever, dry cough, worsening shortness of breath over the past 2 days, noted to be hypoxic prior to admission with sats of 76% on room air requiring O2 supplementation nonrebreather max at 15 L to keep pulse ox at 98%.  Patient currently on heated high flow nasal cannula with O2 flow rate of 55 L/min.  Patient clinically seems to be improving however patient with increasing O2 needs.  Ferritin at 814 from 1384 from 1682.  CRP trending down currently at 5.1 from 8.2 from 18.5 from 27.6.  Last procalcitonin was 3.31.  Fibrin derivatives of 1100 from 1557 from 2198 from 2943.  Patient was started on IV remdesivir and IV Decadron.  IV Decadron discontinued and patient placed on IV Solu-Medrol.   Procalcitonin elevated on admission and trending down, as such we will continue IV Rocephin and azithromycin for concerns for suspected superimposed bacterial pneumonia. Continue baricitinib, IV remdesivir, IV Solu-Medrol, vitamin C, zinc, Combivent MDI, Flonase, Claritin, PPI, antitussives Supportive care.   #2.  Thrombocytopenia Likely secondary to problem #1.  No overt bleeding.  Thrombocytopenia improved.  Follow.  #3.  Acute kidney injury Likely secondary to a prerenal azotemia.  Last creatinine noted on epic from 2013 was 0.86.  Creatinine on admission 2.57.  Urinalysis nitrite negative, leukocytes negative, one hundred protein.  UOP  of 250 cc over the past 24 hours.  Renal function improved creatinine currently at 1.14.  IV fluids have been saline lock.  Patient to receive a dose of IV Lasix due to increasing oxygenation needs.  Monitor renal function with diuresis.      DVT prophylaxis: Lovenox Code Status: Full Family Communication: Updated the patient.  Tried calling Contact Arline Asp however no  answer.  Disposition:   Status is: Inpatient    Dispo: The patient is from: Home              Anticipated d/c is to: Home              Anticipated d/c date is: To be determined              Patient currently with high O2 requirements on heated high flow nasal cannula at 55 L/min, on IV remdesivir, IV antibiotics, baricitinib, not stable for discharge.       Consultants:   None  Procedures:   Chest x-ray 08/27/2020  Antimicrobials:   IV azithromycin 08/27/2020>>>>> oral azithromycin 08/30/2020>>>  IV Rocephin 08/27/2020>>>>>  IV remdesivir 08/27/2020>>>>>  Baricitinib 08/28/2020>>>>> 09/11/2020   Subjective: Patient sitting up in bed watching television.  States shortness of breath is improving daily.  Denies any chest pain.  On 55 L of heated high flow nasal cannula with sats of 97%.  Objective: Vitals:   08/30/20 1300 08/30/20 1443 08/30/20 2314 08/31/20 0846  BP:  124/85 128/81 134/84  Pulse: 66  65   Resp: (!) 33 (!) 33    Temp:  98 F (36.7 C) 98.6 F (37 C) 98.5 F (36.9 C)  TempSrc:  Oral Oral Oral  SpO2: 97%  97%   Weight:      Height:        Intake/Output Summary (Last 24 hours) at 08/31/2020 1032 Last data filed at 08/30/2020 1314 Gross per 24 hour  Intake --  Output 250 ml  Net -250 ml   Filed Weights   08/27/20 1052 08/29/20 1621 08/30/20 0505  Weight: 93 kg 87 kg 81.2 kg    Examination:  General exam: NAD.  Respiratory system: Decreased breath sounds in the bases.  Fair air movement.  No wheezes, no crackles.  On Heated high flow [place] 55 L/min.  Cardiovascular system: RRR no murmurs rubs or gallops.  No JVD.  No lower extremity edema.  Gastrointestinal system: Abdomen is nontender, nondistended, soft, positive bowel sounds.  No rebound.  No guarding.   Central nervous system: Alert and oriented. No focal neurological deficits. Extremities: Symmetric 5 x 5 power. Skin: No rashes, lesions or ulcers Psychiatry: Judgement and insight appear normal. Mood  & affect appropriate.     Data Reviewed: I have personally reviewed following labs and imaging studies  CBC: Recent Labs  Lab 08/27/20 1108 08/28/20 0400 08/29/20 0725 08/30/20 0607 08/31/20 0542  WBC 4.4 4.9 3.6* 2.5* 2.2*  NEUTROABS 3.8 4.3 2.8 1.8 1.6*  HGB 14.7 13.1 13.1 12.7* 12.9*  HCT 42.6 37.0* 38.8* 37.1* 37.5*  MCV 92.8 90.2 92.6 92.8 92.4  PLT 97* 87* 119* 130* 157    Basic Metabolic Panel: Recent Labs  Lab 08/27/20 1108 08/28/20 0400 08/29/20 0725 08/30/20 0607 08/31/20 0542  NA 137 138 140 138 140  K 3.5 3.8 4.3 4.5 4.6  CL 102 104 106 105 106  CO2 18* 25 23 24 24   GLUCOSE 133* 158* 176* 184* 189*  BUN 46* 41* 44* 41* 35*  CREATININE 2.57* 1.64* 1.37* 1.24 1.14  CALCIUM 8.9 8.7* 9.2 8.9 8.9  MG  --  2.1 2.6* 2.3 2.4  PHOS  --  3.5 3.7 4.3 3.9    GFR: Estimated Creatinine Clearance: 68.5 mL/min (by C-G formula based on SCr of 1.14 mg/dL).  Liver Function Tests: Recent Labs  Lab 08/27/20 1108 08/28/20 0400 08/29/20 0725 08/30/20 0607 08/31/20 0542  AST 79* 72* 58* 47* 34  ALT 33 32 30 31 31   ALKPHOS 50 42 43 39 39  BILITOT 1.5* 0.6 0.7 0.7 0.6  PROT 8.3* 7.2 7.3 7.0 6.8  ALBUMIN 3.9 3.1* 3.3* 2.9* 2.9*    CBG: No results for input(s): GLUCAP in the last 168 hours.   Recent Results (from the past 240 hour(s))  Blood culture (routine single)     Status: None (Preliminary result)   Collection Time: 08/27/20 11:08 AM   Specimen: BLOOD  Result Value Ref Range Status   Specimen Description BLOOD RIGHT HAND  Final   Special Requests   Final    BOTTLES DRAWN AEROBIC AND ANAEROBIC Blood Culture adequate volume   Culture   Final    NO GROWTH 4 DAYS Performed at [facility], [address], [place], [place]    Report Status PENDING  Incomplete  SARS Coronavirus 2 by RT PCR (hospital order, performed in [facility] hospital lab) Nasopharyngeal Nasopharyngeal Swab     Status: Abnormal   Collection Time: 08/27/20 11:08  AM   Specimen: Nasopharyngeal Swab  Result Value Ref Range Status   SARS Coronavirus 2 POSITIVE (A) NEGATIVE Final    Comment: RESULT CALLED TO, READ BACK BY AND VERIFIED WITH: MITCH BARKER AT 1310 ON 08/27/2020 [facility]. (NOTE) SARS-CoV-2 target nucleic acids are DETECTED  SARS-CoV-2 RNA is generally detectable in upper respiratory specimens  during the acute phase of infection.  Positive results are indicative  of the presence of the identified virus, but do not rule out bacterial infection or co-infection with other pathogens not detected by the test.  Clinical correlation with patient history and  other diagnostic information is necessary to determine patient infection status.  The expected result is negative.  Fact Sheet for Patients:   10/27/2020   Fact Sheet for Healthcare Providers:   [employer]ilerBrush[employer]com[employer]cy    This test is not yet approved or cleared by the https://pope.com/ FDA and  has been authorized for detection and/or diagnosis of SARS-CoV-2 by FDA under an Emergency Use Authorization (EUA).  This EUA will remain in effect (meaning this  test can be used) for the duration of  the COVID-19 declaration under Section 564(b)(1) of the Act, 21 U.S.C. section 360-bbb-3(b)(1), unless the authorization is terminated or revoked sooner.  Performed at [facility], [address] [place], [place]   Urine culture     Status: Abnormal   Collection Time: 08/27/20  3:52 PM   Specimen: In/Out Cath Urine  Result Value Ref Range Status   Specimen Description   Final    IN/OUT CATH URINE Performed at [facility], [address] [place], [place]    Special Requests   Final    NONE Performed at [facility], [address] [place], [place]    Culture MULTIPLE SPECIES PRESENT, SUGGEST RECOLLECTION (A)  Final   Report Status 08/29/2020 FINAL  Final         Radiology  Studies: No results found.      Scheduled Meds:  vitamin C  500 mg Oral Daily   azithromycin  500 mg Oral Daily   baricitinib  4 mg Oral Daily   enoxaparin (LOVENOX) injection  40 mg Subcutaneous Q24H   fluticasone  2 spray Each Nare Daily   Ipratropium-Albuterol  2 puff Inhalation Q6H   loratadine  10 mg Oral Daily   methylPREDNISolone (SOLU-MEDROL) injection  60 mg Intravenous Q12H   pantoprazole  40 mg Oral Q0600   sodium chloride flush  3 mL Intravenous Q12H   zinc sulfate  220 mg Oral Daily   Continuous Infusions:  sodium chloride 250 mL (08/30/20 1436)   cefTRIAXone (ROCEPHIN)  IV 1 g (08/30/20 1314)   remdesivir 100 mg in NS 100 mL 100 mg (08/30/20 0927)     LOS: 4 days    Time spent: 40 minutes    Ramiro Harvest, MD [facility]   To contact the attending provider between 7A-7P or the covering provider during after hours 7P-7A, please log into the web site [email or web address] and access using universal [place] password for that web site. If you do not have the password, please call the hospital operator.  08/31/2020, 10:32 AM

## 2020-09-01 LAB — COMPREHENSIVE METABOLIC PANEL
ALT: 25 U/L (ref 0–44)
AST: 23 U/L (ref 15–41)
Albumin: 3.1 g/dL — ABNORMAL LOW (ref 3.5–5.0)
Alkaline Phosphatase: 40 U/L (ref 38–126)
Anion gap: 10 (ref 5–15)
BUN: 34 mg/dL — ABNORMAL HIGH (ref 8–23)
CO2: 25 mmol/L (ref 22–32)
Calcium: 9.1 mg/dL (ref 8.9–10.3)
Chloride: 104 mmol/L (ref 98–111)
Creatinine, Ser: 1.15 mg/dL (ref 0.61–1.24)
GFR calc Af Amer: >60 mL/min (ref >60)
GFR calc non Af Amer: >60 mL/min (ref >60)
Glucose, Bld: 193 mg/dL — ABNORMAL HIGH (ref 70–99)
Potassium: 4.5 mmol/L (ref 3.5–5.1)
Sodium: 139 mmol/L (ref 135–145)
Total Bilirubin: 0.7 mg/dL (ref 0.3–1.2)
Total Protein: 7.1 g/dL (ref 6.5–8.1)

## 2020-09-01 LAB — CULTURE, BLOOD (SINGLE)
Culture: NO GROWTH
Special Requests: ADEQUATE

## 2020-09-01 LAB — CBC WITH DIFFERENTIAL/PLATELET
Abs Immature Granulocytes: 0.03 10*3/uL (ref 0.00–0.07)
Basophils Absolute: 0 10*3/uL (ref 0.0–0.1)
Basophils Relative: 0 %
Eosinophils Absolute: 0 10*3/uL (ref 0.0–0.5)
Eosinophils Relative: 0 %
HCT: 39 % (ref 39.0–52.0)
Hemoglobin: 13.3 g/dL (ref 13.0–17.0)
Immature Granulocytes: 1 %
Lymphocytes Relative: 16 %
Lymphs Abs: 0.5 10*3/uL — ABNORMAL LOW (ref 0.7–4.0)
MCH: 31.4 pg (ref 26.0–34.0)
MCHC: 34.1 g/dL (ref 30.0–36.0)
MCV: 92 fL (ref 80.0–100.0)
Monocytes Absolute: 0.4 10*3/uL (ref 0.1–1.0)
Monocytes Relative: 11 %
Neutro Abs: 2.3 10*3/uL (ref 1.7–7.7)
Neutrophils Relative %: 72 %
Platelets: 200 10*3/uL (ref 150–400)
RBC: 4.24 MIL/uL (ref 4.22–5.81)
RDW: 12.9 % (ref 11.5–15.5)
WBC: 3.2 10*3/uL — ABNORMAL LOW (ref 4.0–10.5)
nRBC: 0 % (ref 0.0–0.2)

## 2020-09-01 LAB — PROCALCITONIN: Procalcitonin: 0.23 ng/mL

## 2020-09-01 LAB — FIBRIN DERIVATIVES D-DIMER (ARMC ONLY): Fibrin derivatives D-dimer (ARMC): 794.25 ng/mL (FEU) — ABNORMAL HIGH (ref 0.00–499.00)

## 2020-09-01 LAB — MAGNESIUM: Magnesium: 2.3 mg/dL (ref 1.7–2.4)

## 2020-09-01 LAB — PHOSPHORUS: Phosphorus: 3.7 mg/dL (ref 2.5–4.6)

## 2020-09-01 LAB — FERRITIN: Ferritin: 815 ng/mL — ABNORMAL HIGH (ref 24–336)

## 2020-09-01 LAB — C-REACTIVE PROTEIN: CRP: 3 mg/dL — ABNORMAL HIGH (ref <1.0)

## 2020-09-01 MED ORDER — FUROSEMIDE 10 MG/ML IJ SOLN
40.0000 mg | Freq: Once | INTRAMUSCULAR | Status: AC
  Administered 2020-09-01: 40 mg via INTRAVENOUS
  Filled 2020-09-01: qty 4

## 2020-09-01 NOTE — Progress Notes (Signed)
PROGRESS NOTE    Randy Ray  CHY:[id number] DOB: 1956/07/23 DOA: 08/27/2020 PCP: Patient, No Pcp Per    Chief Complaint  Patient presents with  . Shortness of Breath  . Fever    Brief Narrative:  HPI per Dr. Kevan Ny is a 64 y.o. male with medical history no significant past medical history who was brought into the ER by EMS for evaluation of fever, cough and shortness of breath. Patient stated that for over a week he has been experiencing intermittent fever, body aches and a dry cough.  He also complained of poor appetite.  Over the last 2 days he developed shortness of breath which has progressively worsened.  He denies having any chest pains, no palpitations, no diaphoresis, no nausea, no vomiting or any changes in his bowel habits. When EMS arrived patient was found to have room air pulse oximetry of 76% and was placed on a nonrebreather mask with O2 at 10 L improving his pulse oximetry to 93%.  He also received bronchodilator therapy. Patient is unvaccinated and denied having any Covid exposure. Upon arrival to the ER he received Solu-Medrol 125mg  IV x 1 dose since he was wheezing. Presents with chief complaint of breathing, creatinine 2.57, BNP 37.8 lactic acid 2.1, procalcitonin 13.5, white count 4.4 with a left shift. His SARS coronavirus 2 test is positive Chest x-ray reviewed by me shows bibasilar opacities consistent with viral pneumonia Twelve-lead EKG reviewed by me shows sinus tachycardia   ED Course: Patient is a 64 year old African-American male who was brought into the ER by EMS for evaluation of shortness of breath and hypoxia in the field.  Patient is unvaccinated.  He received a dose of Solu-Medrol 125 mg as well as remdesivir in the ER and admitted to the hospital for further evaluation.  Assessment & Plan:   Principal Problem:   Acute hypoxemic respiratory failure due to COVID-19 [facility]) Active Problems:   Pneumonia due to COVID-19 virus    Thrombocytopenia (HCC)   Acute respiratory failure due to COVID-19 (HCC)   AKI (acute kidney injury) (HCC)  #1 acute respiratory failure due to COVID-19 virus Patient presented with symptoms of intermittent fever, dry cough, worsening shortness of breath over the past 2 days, noted to be hypoxic prior to admission with sats of 76% on room air requiring O2 supplementation nonrebreather max at 15 L to keep pulse ox at 98%.  Patient currently on heated high flow nasal cannula with O2 flow rate of 55 L/min.  Patient clinically seems to be improving however patient with increasing O2 needs.  Ferritin at 814 from 1384 from 1682.  CRP trending down currently at 3 from 5.1 from 8.2 from 18.5 from 27.6.  Procalcitonin currently on 0.23 from 3.31.  Fibrin derivatives of 794 from 1100 from 1557 from 2198 from 2943.  Patient was started on IV remdesivir and IV Decadron.  IV Decadron discontinued and patient placed on IV Solu-Medrol.   Procalcitonin elevated on admission and trending down, as such we will continue IV Rocephin and azithromycin for concerns for suspected superimposed bacterial pneumonia. Continue baricitinib, IV remdesivir, IV Solu-Medrol, vitamin C, zinc, Combivent MDI, Flonase, Claritin, PPI, antitussives Supportive care.  Patient received a dose of Lasix 40 mg IV x1 on 08/31/2020 with a urine output of 2.4 L.  We will give another dose of Lasix 40 mg IV x1 to see whether that improves patient's hypoxia.  Could likely discontinue antibiotics in the next 1 to 2 days.  #  2.  Thrombocytopenia Likely secondary to problem #1.  No overt bleeding.  Thrombocytopenia resolved.  Platelet count of 200.   #3.  Acute kidney injury Likely secondary to a prerenal azotemia.  Last creatinine noted on epic from 2013 was 0.86.  Creatinine on admission 2.57.  Urinalysis nitrite negative, leukocytes negative, one hundred protein.  UOP of 2400 cc over the past 24 hours.  Renal function improved creatinine currently at 1.15.   IV fluids have been saline lock.  Patient received a dose of IV Lasix on 08/31/2020.  Will order another dose of IV Lasix.  Monitor renal function.      DVT prophylaxis: Lovenox Code Status: Full Family Communication: Updated the patient.  Disposition:   Status is: Inpatient    Dispo: The patient is from: Home              Anticipated d/c is to: Home              Anticipated d/c date is: To be determined              Patient currently with high O2 requirements on heated high flow nasal cannula at 55 L/min, on IV remdesivir, IV antibiotics, baricitinib, not stable for discharge.       Consultants:   None  Procedures:   Chest x-ray 08/27/2020  Antimicrobials:   IV azithromycin 08/27/2020>>>>> oral azithromycin 08/30/2020>>>  IV Rocephin 08/27/2020>>>>>  IV remdesivir 08/27/2020>>>>> 08/31/2020  Baricitinib 08/28/2020>>>>> 09/11/2020   Subjective: Sitting up in bed watching television.  Feels shortness of breath is improving daily.  Denies any chest pain.  Asking when he is going to be able to go home as he feels he needs to be going back to work.  Currently on heated high flow nasal cannula of 55 L with FiO2 of 65.    Objective: Vitals:   08/31/20 2058 09/01/20 0443 09/01/20 0740 09/01/20 1004  BP:  (!) 118/92 132/84   Pulse:  66 66   Resp:  (!) 22 16   Temp:  97.8 F (36.6 C) 98.2 F (36.8 C)   TempSrc:  Oral Oral   SpO2: 91% 92% 93% 91%  Weight:  84.1 kg    Height:        Intake/Output Summary (Last 24 hours) at 09/01/2020 1103 Last data filed at 09/01/2020 0742 Gross per 24 hour  Intake 940 ml  Output 2625 ml  Net -1685 ml   Filed Weights   08/29/20 1621 08/30/20 0505 09/01/20 0443  Weight: 87 kg 81.2 kg 84.1 kg    Examination:  General exam: NAD.  Respiratory system: Decreased breath sounds in the bases.  Fair air movement.  No crackles.  No wheezing.  On heated high flow nasal cannula 55 L/min of 65 and sats of 94%. Cardiovascular system: Regular rate rhythm  no murmurs rubs or gallops.  No JVD.  No lower extremity edema. Gastrointestinal system: Abdomen is soft, nontender, nondistended, positive bowel sounds.  No rebound.  No guarding. Central nervous system: Alert and oriented. No focal neurological deficits. Extremities: Symmetric 5 x 5 power. Skin: No rashes, lesions or ulcers Psychiatry: Judgement and insight appear normal. Mood & affect appropriate.     Data Reviewed: I have personally reviewed following labs and imaging studies  CBC: Recent Labs  Lab 08/28/20 0400 08/29/20 0725 08/30/20 0607 08/31/20 0542 09/01/20 0658  WBC 4.9 3.6* 2.5* 2.2* 3.2*  NEUTROABS 4.3 2.8 1.8 1.6* 2.3  HGB 13.1 13.1 12.7* 12.9* 13.3  HCT 37.0* 38.8* 37.1* 37.5* 39.0  MCV 90.2 92.6 92.8 92.4 92.0  PLT 87* 119* 130* 157 200    Basic Metabolic Panel: Recent Labs  Lab 08/28/20 0400 08/29/20 0725 08/30/20 0607 08/31/20 0542 09/01/20 0658  NA 138 140 138 140 139  K 3.8 4.3 4.5 4.6 4.5  CL 104 106 105 106 104  CO2 25 23 24 24 25   GLUCOSE 158* 176* 184* 189* 193*  BUN 41* 44* 41* 35* 34*  CREATININE 1.64* 1.37* 1.24 1.14 1.15  CALCIUM 8.7* 9.2 8.9 8.9 9.1  MG 2.1 2.6* 2.3 2.4 2.3  PHOS 3.5 3.7 4.3 3.9 3.7    GFR: Estimated Creatinine Clearance: 69.1 mL/min (by C-G formula based on SCr of 1.15 mg/dL).  Liver Function Tests: Recent Labs  Lab 08/28/20 0400 08/29/20 0725 08/30/20 0607 08/31/20 0542 09/01/20 0658  AST 72* 58* 47* 34 23  ALT 32 30 31 31 25   ALKPHOS 42 43 39 39 40  BILITOT 0.6 0.7 0.7 0.6 0.7  PROT 7.2 7.3 7.0 6.8 7.1  ALBUMIN 3.1* 3.3* 2.9* 2.9* 3.1*    CBG: No results for input(s): GLUCAP in the last 168 hours.   Recent Results (from the past 240 hour(s))  Blood culture (routine single)     Status: None   Collection Time: 08/27/20 11:08 AM   Specimen: BLOOD  Result Value Ref Range Status   Specimen Description BLOOD RIGHT HAND  Final   Special Requests   Final    BOTTLES DRAWN AEROBIC AND ANAEROBIC Blood  Culture adequate volume   Culture   Final    NO GROWTH 5 DAYS Performed at [facility], [address] [place], [address] [place]    Report Status 09/01/2020 FINAL  Final  SARS Coronavirus 2 by RT PCR (hospital order, performed in [facility] hospital lab) Nasopharyngeal Nasopharyngeal Swab     Status: Abnormal   Collection Time: 08/27/20 11:08 AM   Specimen: Nasopharyngeal Swab  Result Value Ref Range Status   SARS Coronavirus 2 POSITIVE (A) NEGATIVE Final    Comment: RESULT CALLED TO, READ BACK BY AND VERIFIED WITH: MITCH BARKER AT 1310 ON 08/27/2020 [facility]. (NOTE) SARS-CoV-2 target nucleic acids are DETECTED  SARS-CoV-2 RNA is generally detectable in upper respiratory specimens  during the acute phase of infection.  Positive results are indicative  of the presence of the identified virus, but do not rule out bacterial infection or co-infection with other pathogens not detected by the test.  Clinical correlation with patient history and  other diagnostic information is necessary to determine patient infection status.  The expected result is negative.  Fact Sheet for Patients:   10/27/20   Fact Sheet for Healthcare Providers:   10/27/2020    This test is not yet approved or cleared by the [employer]ilerBrush.com.cy FDA and  has been authorized for detection and/or diagnosis of SARS-CoV-2 by FDA under an Emergency Use Authorization (EUA).  This EUA will remain in effect (meaning this  test can be used) for the duration of  the COVID-19 declaration under Section 564(b)(1) of the Act, 21 U.S.C. section 360-bbb-3(b)(1), unless the authorization is terminated or revoked sooner.  Performed at [facility], [address], [place], [address] [place]   Urine culture     Status: Abnormal   Collection Time: 08/27/20  3:52 PM   Specimen: In/Out Cath Urine  Result Value Ref Range Status   Specimen Description    Final    IN/OUT CATH  URINE Performed at [facility], [address], [place], [place] [address]    Special Requests   Final    NONE Performed at [facility], [address] [place], [place] [address]    Culture MULTIPLE SPECIES PRESENT, SUGGEST RECOLLECTION (A)  Final   Report Status 08/29/2020 FINAL  Final         Radiology Studies: No results found.      Scheduled Meds: . vitamin C  500 mg Oral Daily  . azithromycin  500 mg Oral Daily  . baricitinib  4 mg Oral Daily  . enoxaparin (LOVENOX) injection  40 mg Subcutaneous Q24H  . fluticasone  2 spray Each Nare Daily  . Ipratropium-Albuterol  2 puff Inhalation Q6H  . loratadine  10 mg Oral Daily  . methylPREDNISolone (SOLU-MEDROL) injection  60 mg Intravenous Q12H  . pantoprazole  40 mg Oral Q0600  . sodium chloride flush  3 mL Intravenous Q12H  . zinc sulfate  220 mg Oral Daily   Continuous Infusions: . sodium chloride 250 mL (08/30/20 1436)  . cefTRIAXone (ROCEPHIN)  IV 1 g (09/01/20 0944)     LOS: 5 days    Time spent: 40 minutes    Ramiro Harvest, MD [facility]   To contact the attending provider between 7A-7P or the covering provider during after hours 7P-7A, please log into the web site [email or web address] and access using universal [place] password for that web site. If you do not have the password, please call the hospital operator.  09/01/2020, 11:03 AM

## 2020-09-02 LAB — CBC WITH DIFFERENTIAL/PLATELET
Abs Immature Granulocytes: 0.06 10*3/uL (ref 0.00–0.07)
Basophils Absolute: 0 10*3/uL (ref 0.0–0.1)
Basophils Relative: 0 %
Eosinophils Absolute: 0 10*3/uL (ref 0.0–0.5)
Eosinophils Relative: 0 %
HCT: 42.1 % (ref 39.0–52.0)
Hemoglobin: 14.5 g/dL (ref 13.0–17.0)
Immature Granulocytes: 2 %
Lymphocytes Relative: 11 %
Lymphs Abs: 0.4 10*3/uL — ABNORMAL LOW (ref 0.7–4.0)
MCH: 31.6 pg (ref 26.0–34.0)
MCHC: 34.4 g/dL (ref 30.0–36.0)
MCV: 91.7 fL (ref 80.0–100.0)
Monocytes Absolute: 0.3 10*3/uL (ref 0.1–1.0)
Monocytes Relative: 8 %
Neutro Abs: 3.2 10*3/uL (ref 1.7–7.7)
Neutrophils Relative %: 79 %
Platelets: 232 10*3/uL (ref 150–400)
RBC: 4.59 MIL/uL (ref 4.22–5.81)
RDW: 13 % (ref 11.5–15.5)
Smear Review: NORMAL
WBC: 4 10*3/uL (ref 4.0–10.5)
nRBC: 0 % (ref 0.0–0.2)

## 2020-09-02 LAB — MAGNESIUM: Magnesium: 2.5 mg/dL — ABNORMAL HIGH (ref 1.7–2.4)

## 2020-09-02 LAB — RENAL FUNCTION PANEL
Albumin: 3.2 g/dL — ABNORMAL LOW (ref 3.5–5.0)
Anion gap: 10 (ref 5–15)
BUN: 37 mg/dL — ABNORMAL HIGH (ref 8–23)
CO2: 24 mmol/L (ref 22–32)
Calcium: 9.2 mg/dL (ref 8.9–10.3)
Chloride: 101 mmol/L (ref 98–111)
Creatinine, Ser: 1.1 mg/dL (ref 0.61–1.24)
GFR calc Af Amer: >60 mL/min (ref >60)
GFR calc non Af Amer: >60 mL/min (ref >60)
Glucose, Bld: 201 mg/dL — ABNORMAL HIGH (ref 70–99)
Phosphorus: 4.3 mg/dL (ref 2.5–4.6)
Potassium: 4.8 mmol/L (ref 3.5–5.1)
Sodium: 135 mmol/L (ref 135–145)

## 2020-09-02 LAB — C-REACTIVE PROTEIN: CRP: 1.7 mg/dL — ABNORMAL HIGH (ref <1.0)

## 2020-09-02 LAB — PROCALCITONIN: Procalcitonin: <0.1 ng/mL

## 2020-09-02 LAB — FIBRIN DERIVATIVES D-DIMER (ARMC ONLY): Fibrin derivatives D-dimer (ARMC): 593.36 ng/mL (FEU) — ABNORMAL HIGH (ref 0.00–499.00)

## 2020-09-02 NOTE — Progress Notes (Addendum)
PROGRESS NOTE    Randy Ray  AYT:[id number] DOB: [birth year] DOA: 08/27/2020 PCP: Patient, No Pcp Per    Chief Complaint  Patient presents with  . Shortness of Breath  . Fever    Brief Narrative:  HPI per Dr. Kevan Ny is a 64 y.o. male with medical history no significant past medical history who was brought into the ER by EMS for evaluation of fever, cough and shortness of breath. Patient stated that for over a week he has been experiencing intermittent fever, body aches and a dry cough.  He also complained of poor appetite.  Over the last 2 days he developed shortness of breath which has progressively worsened.  He denies having any chest pains, no palpitations, no diaphoresis, no nausea, no vomiting or any changes in his bowel habits. When EMS arrived patient was found to have room air pulse oximetry of 76% and was placed on a nonrebreather mask with O2 at 10 L improving his pulse oximetry to 93%.  He also received bronchodilator therapy. Patient is unvaccinated and denied having any Covid exposure. Upon arrival to the ER he received Solu-Medrol 125mg  IV x 1 dose since he was wheezing. Presents with chief complaint of breathing, creatinine 2.57, BNP 37.8 lactic acid 2.1, procalcitonin 13.5, white count 4.4 with a left shift. His SARS coronavirus 2 test is positive Chest x-ray reviewed by me shows bibasilar opacities consistent with viral pneumonia Twelve-lead EKG reviewed by me shows sinus tachycardia   ED Course: Patient is a 64 year old African-American male who was brought into the ER by EMS for evaluation of shortness of breath and hypoxia in the field.  Patient is unvaccinated.  He received a dose of Solu-Medrol 125 mg as well as remdesivir in the ER and admitted to the hospital for further evaluation.  Assessment & Plan:   Principal Problem:   Acute hypoxemic respiratory failure due to COVID-19 [facility]) Active Problems:   Pneumonia due to COVID-19 virus    Thrombocytopenia (HCC)   Acute respiratory failure due to COVID-19 (HCC)   AKI (acute kidney injury) (HCC)  #1 acute respiratory failure due to COVID-19 virus Patient presented with symptoms of intermittent fever, dry cough, worsening shortness of breath over the past 2 days, noted to be hypoxic prior to admission with sats of 76% on room air requiring O2 supplementation nonrebreather max at 15 L to keep pulse ox at 98%.  Patient currently on heated high flow nasal cannula with O2 flow rate of 55 L/min.  Patient clinically seems to be improving however patient with increased O2 needs.  Ferritin at 815 from 814 from 1384 from 1682.  CRP trending down currently at 1.7 from 3 from 5.1 from 8.2 from 18.5 from 27.6.  Procalcitonin currently < 0.10 from 0.23 from 3.31.  Fibrin derivatives of 593 from 794 from 1100 from 1557 from 2198 from 2943.  Patient was started on IV remdesivir and IV Decadron.  IV Decadron discontinued and patient placed on IV Solu-Medrol.   Procalcitonin elevated on admission and trending down, as such patient was continued on IV Rocephin and azithromycin due to concerns for suspected superimposed bacterial pneumonia.  Today is day #7 of antibiotics and as such we will discontinue antibiotics after today's doses.  Continue baricitinib, IV Solu-Medrol, vitamin C, zinc, Combivent MDI, Flonase, Claritin, PPI, antitussives.  Status post IV remdesivir.  Supportive care.   #2.  Thrombocytopenia Likely secondary to problem #1.  No overt bleeding.  Thrombocytopenia resolved.  Platelet  count of 200.   #3.  Acute kidney injury Likely secondary to a prerenal azotemia.  Last creatinine noted on epic from 2013 was 0.86.  Creatinine on admission 2.57.  Urinalysis nitrite negative, leukocytes negative, one hundred protein.  UOP of 2100 cc over the past 24 hours after a dose of IV Lasix.  Renal function currently at 1.10.      DVT prophylaxis: Lovenox Code Status: Full Family Communication:  Updated the patient.  Disposition:   Status is: Inpatient    Dispo: The patient is from: Home              Anticipated d/c is to: Home              Anticipated d/c date is: To be determined              Patient currently with high O2 requirements on heated high flow nasal cannula at 55 L/min, on IV remdesivir, IV antibiotics, baricitinib, not stable for discharge with increased O2 requirements.       Consultants:   None  Procedures:   Chest x-ray 08/27/2020  Antimicrobials:   IV azithromycin 08/27/2020>>>>> oral azithromycin 08/30/2020>>>  IV Rocephin 08/27/2020>>>>>  IV remdesivir 08/27/2020>>>>> 08/31/2020  Baricitinib 08/28/2020>>>>> 09/11/2020   Subjective: Sleeping but arousable.  Feels shortness of breath is improving daily.  Feeling better.  Denies any chest pain.  Still on heated high flow nasal cannula at 55 L with FiO2 of 67.   Objective: Vitals:   09/01/20 2008 09/02/20 0547 09/02/20 0750 09/02/20 0930  BP: 120/76 118/84 121/77   Pulse: 66 64 68   Resp: (!) 24  18   Temp: 98 F (36.7 C) 98 F (36.7 C) (!) 97.4 F (36.3 C)   TempSrc: Oral Oral Oral   SpO2: 94% 93% 90% 91%  Weight:  79.9 kg    Height:        Intake/Output Summary (Last 24 hours) at 09/02/2020 1128 Last data filed at 09/02/2020 0923 Gross per 24 hour  Intake --  Output 2100 ml  Net -2100 ml   Filed Weights   08/30/20 0505 09/01/20 0443 09/02/20 0547  Weight: 81.2 kg 84.1 kg 79.9 kg    Examination:  General exam: NAD.  Respiratory system: Fair air movement.  No crackles.  No wheezing.  Decreased breath sounds in the bases.  Speaking in full sentences.  Decreased breath sounds in the bases.  On heated high flow nasal cannula at 55 L/min with sats of 93%.   Cardiovascular system: RRR no murmurs rubs or gallops.  No JVD.  No lower extremity edema.  Gastrointestinal system: Abdomen soft, nontender, nondistended, positive bowel sounds.  No rebound.  No guarding. Central nervous system: Alert and  oriented. No focal neurological deficits. Extremities: Symmetric 5 x 5 power. Skin: No rashes, lesions or ulcers Psychiatry: Judgement and insight appear normal. Mood & affect appropriate.     Data Reviewed: I have personally reviewed following labs and imaging studies  CBC: Recent Labs  Lab 08/29/20 0725 08/30/20 0607 08/31/20 0542 09/01/20 0658 09/02/20 0647  WBC 3.6* 2.5* 2.2* 3.2* 4.0  NEUTROABS 2.8 1.8 1.6* 2.3 3.2  HGB 13.1 12.7* 12.9* 13.3 14.5  HCT 38.8* 37.1* 37.5* 39.0 42.1  MCV 92.6 92.8 92.4 92.0 91.7  PLT 119* 130* 157 200 232    Basic Metabolic Panel: Recent Labs  Lab 08/29/20 0725 08/30/20 0607 08/31/20 0542 09/01/20 0658 09/02/20 0647  NA 140 138 140 139 135  K  4.3 4.5 4.6 4.5 4.8  CL 106 105 106 104 101  CO2 23 24 24 25 24   GLUCOSE 176* 184* 189* 193* 201*  BUN 44* 41* 35* 34* 37*  CREATININE 1.37* 1.24 1.14 1.15 1.10  CALCIUM 9.2 8.9 8.9 9.1 9.2  MG 2.6* 2.3 2.4 2.3 2.5*  PHOS 3.7 4.3 3.9 3.7 4.3    GFR: Estimated Creatinine Clearance: 70.5 mL/min (by C-G formula based on SCr of 1.1 mg/dL).  Liver Function Tests: Recent Labs  Lab 08/28/20 0400 08/28/20 0400 08/29/20 0725 08/30/20 0607 08/31/20 0542 09/01/20 0658 09/02/20 0647  AST 72*  --  58* 47* 34 23  --   ALT 32  --  30 31 31 25   --   ALKPHOS 42  --  43 39 39 40  --   BILITOT 0.6  --  0.7 0.7 0.6 0.7  --   PROT 7.2  --  7.3 7.0 6.8 7.1  --   ALBUMIN 3.1*   < > 3.3* 2.9* 2.9* 3.1* 3.2*   < > = values in this interval not displayed.    CBG: No results for input(s): GLUCAP in the last 168 hours.   Recent Results (from the past 240 hour(s))  Blood culture (routine single)     Status: None   Collection Time: 08/27/20 11:08 AM   Specimen: BLOOD  Result Value Ref Range Status   Specimen Description BLOOD RIGHT HAND  Final   Special Requests   Final    BOTTLES DRAWN AEROBIC AND ANAEROBIC Blood Culture adequate volume   Culture   Final    NO GROWTH 5 DAYS Performed at  [facility], [address] [place], [address] [place]    Report Status 09/01/2020 FINAL  Final  SARS Coronavirus 2 by RT PCR (hospital order, performed in [facility] hospital lab) Nasopharyngeal Nasopharyngeal Swab     Status: Abnormal   Collection Time: 08/27/20 11:08 AM   Specimen: Nasopharyngeal Swab  Result Value Ref Range Status   SARS Coronavirus 2 POSITIVE (A) NEGATIVE Final    Comment: RESULT CALLED TO, READ BACK BY AND VERIFIED WITH: MITCH BARKER AT 1310 ON 08/27/2020 [facility]. (NOTE) SARS-CoV-2 target nucleic acids are DETECTED  SARS-CoV-2 RNA is generally detectable in upper respiratory specimens  during the acute phase of infection.  Positive results are indicative  of the presence of the identified virus, but do not rule out bacterial infection or co-infection with other pathogens not detected by the test.  Clinical correlation with patient history and  other diagnostic information is necessary to determine patient infection status.  The expected result is negative.  Fact Sheet for Patients:   10/27/20   Fact Sheet for Healthcare Providers:   10/27/2020    This test is not yet approved or cleared by the [employer] FDA and  has been authorized for detection and/or diagnosis of SARS-CoV-2 by FDA under an Emergency Use Authorization (EUA).  This EUA will remain in effect (meaning this  test can be used) for the duration of  the COVID-19 declaration under Section 564(b)(1) of the Act, 21 U.S.C. section 360-bbb-3(b)(1), unless the authorization is terminated or revoked sooner.  Performed at [facility], [address] [place], [address] [place]   Urine culture     Status: Abnormal   Collection Time: 08/27/20  3:52 PM   Specimen: In/Out Cath Urine  Result Value Ref Range Status   Specimen Description   Final    IN/OUT CATH  URINE Performed at [facility], [address], [place], [place] [address]    Special Requests   Final    NONE Performed at [facility], [address] [place], [place] [address]    Culture MULTIPLE SPECIES PRESENT, SUGGEST RECOLLECTION (A)  Final   Report Status 08/29/2020 FINAL  Final         Radiology Studies: No results found.      Scheduled Meds: . vitamin C  500 mg Oral Daily  . azithromycin  500 mg Oral Daily  . baricitinib  4 mg Oral Daily  . enoxaparin (LOVENOX) injection  40 mg Subcutaneous Q24H  . fluticasone  2 spray Each Nare Daily  . Ipratropium-Albuterol  2 puff Inhalation Q6H  . loratadine  10 mg Oral Daily  . methylPREDNISolone (SOLU-MEDROL) injection  60 mg Intravenous Q12H  . pantoprazole  40 mg Oral Q0600  . sodium chloride flush  3 mL Intravenous Q12H  . zinc sulfate  220 mg Oral Daily   Continuous Infusions: . sodium chloride 250 mL (09/02/20 1024)  . cefTRIAXone (ROCEPHIN)  IV 1 g (09/02/20 1024)     LOS: 6 days    Time spent: 40 minutes    Ramiro Harvest, MD [facility]   To contact the attending provider between 7A-7P or the covering provider during after hours 7P-7A, please log into the web site [email or web address] and access using universal [employer] password for that web site. If you do not have the password, please call the hospital operator.  09/02/2020, 11:28 AM

## 2020-09-02 NOTE — Progress Notes (Signed)
Patient resting comfortably in bed throughout the day. Very pleasant patient. 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 Vitals have remained stable today on HFNC.

## 2020-09-03 LAB — CBC WITH DIFFERENTIAL/PLATELET
Abs Immature Granulocytes: 0.05 10*3/uL (ref 0.00–0.07)
Basophils Absolute: 0 10*3/uL (ref 0.0–0.1)
Basophils Relative: 0 %
Eosinophils Absolute: 0 10*3/uL (ref 0.0–0.5)
Eosinophils Relative: 0 %
HCT: 40.4 % (ref 39.0–52.0)
Hemoglobin: 13.9 g/dL (ref 13.0–17.0)
Immature Granulocytes: 1 %
Lymphocytes Relative: 7 %
Lymphs Abs: 0.3 10*3/uL — ABNORMAL LOW (ref 0.7–4.0)
MCH: 31.6 pg (ref 26.0–34.0)
MCHC: 34.4 g/dL (ref 30.0–36.0)
MCV: 91.8 fL (ref 80.0–100.0)
Monocytes Absolute: 0.2 10*3/uL (ref 0.1–1.0)
Monocytes Relative: 4 %
Neutro Abs: 4.5 10*3/uL (ref 1.7–7.7)
Neutrophils Relative %: 88 %
Platelets: 268 10*3/uL (ref 150–400)
RBC: 4.4 MIL/uL (ref 4.22–5.81)
RDW: 12.8 % (ref 11.5–15.5)
WBC: 5.1 10*3/uL (ref 4.0–10.5)
nRBC: 0 % (ref 0.0–0.2)

## 2020-09-03 LAB — FIBRIN DERIVATIVES D-DIMER (ARMC ONLY): Fibrin derivatives D-dimer (ARMC): 452.39 ng/mL (FEU) (ref 0.00–499.00)

## 2020-09-03 LAB — C-REACTIVE PROTEIN: CRP: 1.1 mg/dL — ABNORMAL HIGH (ref <1.0)

## 2020-09-03 LAB — BASIC METABOLIC PANEL
Anion gap: 11 (ref 5–15)
BUN: 34 mg/dL — ABNORMAL HIGH (ref 8–23)
CO2: 25 mmol/L (ref 22–32)
Calcium: 9.3 mg/dL (ref 8.9–10.3)
Chloride: 99 mmol/L (ref 98–111)
Creatinine, Ser: 1.09 mg/dL (ref 0.61–1.24)
GFR calc Af Amer: >60 mL/min (ref >60)
GFR calc non Af Amer: >60 mL/min (ref >60)
Glucose, Bld: 245 mg/dL — ABNORMAL HIGH (ref 70–99)
Potassium: 5.1 mmol/L (ref 3.5–5.1)
Sodium: 135 mmol/L (ref 135–145)

## 2020-09-03 MED ORDER — FUROSEMIDE 10 MG/ML IJ SOLN
40.0000 mg | Freq: Once | INTRAMUSCULAR | Status: AC
  Administered 2020-09-03: 40 mg via INTRAVENOUS
  Filled 2020-09-03: qty 4

## 2020-09-03 MED ORDER — METHYLPREDNISOLONE SODIUM SUCC 125 MG IJ SOLR
80.0000 mg | Freq: Two times a day (BID) | INTRAMUSCULAR | Status: DC
  Administered 2020-09-03 – 2020-09-06 (×6): 80 mg via INTRAVENOUS
  Filled 2020-09-03 (×6): qty 2

## 2020-09-03 NOTE — Progress Notes (Signed)
PROGRESS NOTE    Randy Ray  ZOX:[id number] DOB: 1956/11/03 DOA: 08/27/2020 PCP: Patient, No Pcp Per   Brief Narrative:  HPI per Dr. Olive Bass E Crispis a 64 y.o.malewith medical historynosignificantpast medical historywhowas brought into theER by EMS for evaluation of fever,cough and shortness of breath. Patient stated that for over a week he has been experiencing intermittent fever, body aches and a dry cough. He also complained of poor appetite. Over the last 2 days he developed shortness of breath which has progressively worsened. He denies having any chest pains, no palpitations, no diaphoresis, no nausea, no vomiting or any changes in his bowel habits. When EMS arrived patient was found to have room air pulse oximetry of 76% and was placed on a nonrebreather mask with O2 at 10 L improving his pulse oximetry to 93%. He also received bronchodilator therapy. Patient is unvaccinated and denied having any Covid exposure. Upon arrival to the ER he received Solu-Medrol125mg  IV x 1 dose since he was wheezing. Presents with chief complaint of breathing, creatinine 2.57, BNP 37.8 lactic acid 2.1, procalcitonin 13.5,white count 4.4 with a left shift. His SARScoronavirus 2 test is positive Chest x-ray reviewed by me shows bibasilar opacities consistent with viral pneumonia Twelve-lead EKG reviewed by me shows sinus tachycardia  9/8: Patient seen and examined.  Mentating clearly.  Normal work of breathing.  Remains on 55 L high flow nasal cannula at 65% FiO2  Assessment & Plan:   Principal Problem:   Acute hypoxemic respiratory failure due to COVID-19 [facility]) Active Problems:   Pneumonia due to COVID-19 virus   Thrombocytopenia (HCC)   Acute respiratory failure due to COVID-19 (HCC)   AKI (acute kidney injury) (HCC)  Acute respiratory failure secondary to COVID-19 Multifocal pneumonia secondary to COVID-19 Patient has been dependent on heated high flow nasal cannula for  several days We have been unable to wean He continues to mentate clearly with normal work of breathing Inflammatory markers overall downtrending Completed empiric antibiotic course Completed remdesivir course Plan: Continue baricitinib Continue IV Solu-Medrol Stressed I-S and flutter use Prone as tolerated Supplemental vitamins Combivent MDI Antitussives as needed Prognosis guarded Lasix 40 mg IV x1 today, repeat as tolerated  Thrombocytopenia Unclear etiology, suspect reactive secondary to above Now resolved  Acute kidney injury Stable ATN versus prerenal azotemia Creatinine improved, near normal Repeat IV Lasix dose today  DVT prophylaxis: Lovenox Code Status: Full Family Communication: None today.  Offered to call family, patient declined Disposition Plan: Status is: Inpatient  Remains inpatient appropriate because:Inpatient level of care appropriate due to severity of illness   Dispo: The patient is from: Home              Anticipated d/c is to: SNF              Anticipated d/c date is: > 3 days              Patient currently is not medically stable to d/c.  Still markedly hypoxic requiring heated high flow nasal cannula.  Prognosis guarded.  Consultants:   None  Procedures:   None  Antimicrobials:  None   Subjective: Seen and examined.  Complains of some shortness of breath and cough.  Objective: Vitals:   09/03/20 0525 09/03/20 0827 09/03/20 1048 09/03/20 1134  BP: 125/84 119/82  116/78  Pulse: 67 65  73  Resp: (!) Temp:  98.1 F (36.7 C)  98.1 F (36.7 C)  TempSrc:  Oral  Oral  SpO2: 91% 96% 94% 90%  Weight:      Height:        Intake/Output Summary (Last 24 hours) at 09/03/2020 1431 Last data filed at 09/03/2020 1136 Gross per 24 hour  Intake 240 ml  Output 1225 ml  Net -985 ml   Filed Weights   09/01/20 0443 09/02/20 0547 09/03/20 0523  Weight: 84.1 kg 79.9 kg 80.5 kg    Examination:  General exam: Appears calm and  comfortable  Respiratory system: Diffuse scattered crackles bilaterally.  Normal work of breathing.  Heated high flow nasal cannula 55 L  cardiovascular system: S1 & S2 heard, RRR. No JVD, murmurs, rubs, gallops or clicks. No pedal edema. Gastrointestinal system: Abdomen is nondistended, soft and nontender. No organomegaly or masses felt. Normal bowel sounds heard. Central nervous system: Alert and oriented. No focal neurological deficits. Extremities: Symmetric 5 x 5 power. Skin: No rashes, lesions or ulcers Psychiatry: Judgement and insight appear normal. Mood & affect appropriate.     Data Reviewed: I have personally reviewed following labs and imaging studies  CBC: Recent Labs  Lab 08/30/20 0607 08/31/20 0542 09/01/20 0658 09/02/20 0647 09/03/20 0446  WBC 2.5* 2.2* 3.2* 4.0 5.1  NEUTROABS 1.8 1.6* 2.3 3.2 4.5  HGB 12.7* 12.9* 13.3 14.5 13.9  HCT 37.1* 37.5* 39.0 42.1 40.4  MCV 92.8 92.4 92.0 91.7 91.8  PLT 130* 157 200 232 268   Basic Metabolic Panel: Recent Labs  Lab 08/29/20 0725 08/29/20 0725 08/30/20 0607 08/31/20 0542 09/01/20 0658 09/02/20 0647 09/03/20 0446  NA 140   < > 138 140 139 135 135  K 4.3   < > 4.5 4.6 4.5 4.8 5.1  CL 106   < > 105 106 104 101 99  CO2 23   < > 24 24 25 24 25   GLUCOSE 176*   < > 184* 189* 193* 201* 245*  BUN 44*   < > 41* 35* 34* 37* 34*  CREATININE 1.37*   < > 1.24 1.14 1.15 1.10 1.09  CALCIUM 9.2   < > 8.9 8.9 9.1 9.2 9.3  MG 2.6*  --  2.3 2.4 2.3 2.5*  --   PHOS 3.7  --  4.3 3.9 3.7 4.3  --    < > = values in this interval not displayed.   GFR: Estimated Creatinine Clearance: 71.5 mL/min (by C-G formula based on SCr of 1.09 mg/dL). Liver Function Tests: Recent Labs  Lab 08/28/20 0400 08/28/20 0400 08/29/20 0725 08/30/20 0607 08/31/20 0542 09/01/20 0658 09/02/20 0647  AST 72*  --  58* 47* 34 23  --   ALT 32  --  30 31 31 25   --   ALKPHOS 42  --  43 39 39 40  --   BILITOT 0.6  --  0.7 0.7 0.6 0.7  --   PROT 7.2  --   7.3 7.0 6.8 7.1  --   ALBUMIN 3.1*   < > 3.3* 2.9* 2.9* 3.1* 3.2*   < > = values in this interval not displayed.   No results for input(s): LIPASE, AMYLASE in the last 168 hours. No results for input(s): AMMONIA in the last 168 hours. Coagulation Profile: No results for input(s): INR, PROTIME in the last 168 hours. Cardiac Enzymes: No results for input(s): CKTOTAL, CKMB, CKMBINDEX, TROPONINI in the last 168 hours. BNP (last 3 results) No results for input(s): PROBNP in the last 8760 hours. HbA1C: No results for input(s): HGBA1C  in the last 72 hours. CBG: No results for input(s): GLUCAP in the last 168 hours. Lipid Profile: No results for input(s): CHOL, HDL, LDLCALC, TRIG, CHOLHDL, LDLDIRECT in the last 72 hours. Thyroid Function Tests: No results for input(s): TSH, T4TOTAL, FREET4, T3FREE, THYROIDAB in the last 72 hours. Anemia Panel: Recent Labs    09/01/20 0658  FERRITIN 815*   Sepsis Labs: Recent Labs  Lab 08/29/20 0725 08/30/20 0607 09/01/20 0658 09/02/20 0647  PROCALCITON 7.76 3.31 0.23 <0.10    Recent Results (from the past 240 hour(s))  Blood culture (routine single)     Status: None   Collection Time: 08/27/20 11:08 AM   Specimen: BLOOD  Result Value Ref Range Status   Specimen Description BLOOD RIGHT HAND  Final   Special Requests   Final    BOTTLES DRAWN AEROBIC AND ANAEROBIC Blood Culture adequate volume   Culture   Final    NO GROWTH 5 DAYS Performed at [facility], [address] [place], [place] [address]    Report Status 09/01/2020 FINAL  Final  SARS Coronavirus 2 by RT PCR (hospital order, performed in [facility] hospital lab) Nasopharyngeal Nasopharyngeal Swab     Status: Abnormal   Collection Time: 08/27/20 11:08 AM   Specimen: Nasopharyngeal Swab  Result Value Ref Range Status   SARS Coronavirus 2 POSITIVE (A) NEGATIVE Final    Comment: RESULT CALLED TO, READ BACK BY AND VERIFIED WITH: MITCH BARKER AT 1310 ON 08/27/2020  [facility]. (NOTE) SARS-CoV-2 target nucleic acids are DETECTED  SARS-CoV-2 RNA is generally detectable in upper respiratory specimens  during the acute phase of infection.  Positive results are indicative  of the presence of the identified virus, but do not rule out bacterial infection or co-infection with other pathogens not detected by the test.  Clinical correlation with patient history and  other diagnostic information is necessary to determine patient infection status.  The expected result is negative.  Fact Sheet for Patients:   [employer]   Fact Sheet for Healthcare Providers:   https://pope.com/    This test is not yet approved or cleared by the [place] FDA and  has been authorized for detection and/or diagnosis of SARS-CoV-2 by FDA under an Emergency Use Authorization (EUA).  This EUA will remain in effect (meaning this  test can be used) for the duration of  the COVID-19 declaration under Section 564(b)(1) of the Act, 21 U.S.C. section 360-bbb-3(b)(1), unless the authorization is terminated or revoked sooner.  Performed at [facility], [address], [place], [place] [address]   Urine culture     Status: Abnormal   Collection Time: 08/27/20  3:52 PM   Specimen: In/Out Cath Urine  Result Value Ref Range Status   Specimen Description   Final    IN/OUT CATH URINE Performed at [facility], [address], [place], [place] [address]    Special Requests   Final    NONE Performed at [facility], [address] [place], [place] [address]    Culture MULTIPLE SPECIES PRESENT, SUGGEST RECOLLECTION (A)  Final   Report Status 08/29/2020 FINAL  Final         Radiology Studies: No results found.      Scheduled Meds: . vitamin C  500 mg Oral Daily  . baricitinib  4 mg Oral Daily  . enoxaparin (LOVENOX) injection  40 mg Subcutaneous Q24H  . fluticasone  2 spray Each Nare  Daily  . Ipratropium-Albuterol  2  puff Inhalation Q6H  . loratadine  10 mg Oral Daily  . methylPREDNISolone (SOLU-MEDROL) injection  80 mg Intravenous Q12H  . pantoprazole  40 mg Oral Q0600  . sodium chloride flush  3 mL Intravenous Q12H  . zinc sulfate  220 mg Oral Daily   Continuous Infusions: . sodium chloride 250 mL (09/02/20 1024)     LOS: 7 days    Time spent:25 minutes    Tresa Moore, MD [facility] Pager [phone]  If 7PM-7AM, please contact night-coverage 09/03/2020, 2:31 PM

## 2020-09-04 MED ORDER — FUROSEMIDE 10 MG/ML IJ SOLN
40.0000 mg | Freq: Once | INTRAMUSCULAR | Status: AC
  Administered 2020-09-04: 40 mg via INTRAVENOUS
  Filled 2020-09-04: qty 4

## 2020-09-04 NOTE — Progress Notes (Signed)
PROGRESS NOTE    Randy Ray  SAY:[id number] DOB: 10-20-1956 DOA: 08/27/2020 PCP: Patient, No Pcp Per   Brief Narrative:  HPI per Dr. Olive Bass E Crispis a 64 y.o.malewith medical historynosignificantpast medical historywhowas brought into theER by EMS for evaluation of fever,cough and shortness of breath. Patient stated that for over a week he has been experiencing intermittent fever, body aches and a dry cough. He also complained of poor appetite. Over the last 2 days he developed shortness of breath which has progressively worsened. He denies having any chest pains, no palpitations, no diaphoresis, no nausea, no vomiting or any changes in his bowel habits. When EMS arrived patient was found to have room air pulse oximetry of 76% and was placed on a nonrebreather mask with O2 at 10 L improving his pulse oximetry to 93%. He also received bronchodilator therapy. Patient is unvaccinated and denied having any Covid exposure. Upon arrival to the ER he received Solu-Medrol125mg  IV x 1 dose since he was wheezing. Presents with chief complaint of breathing, creatinine 2.57, BNP 37.8 lactic acid 2.1, procalcitonin 13.5,white count 4.4 with a left shift. His SARScoronavirus 2 test is positive Chest x-ray reviewed by me shows bibasilar opacities consistent with viral pneumonia Twelve-lead EKG reviewed by me shows sinus tachycardia  9/8: Patient seen and examined.  Mentating clearly.  Normal work of breathing.  Remains on 55 L high flow nasal cannula at 65% FiO2  9/9: Patient seen and examined.  Continues to mentate clearly.  Little bit agitated this morning about continued hospital stay.  Remains on 55 L high flow nasal cannula at 65% FiO2.  Assessment & Plan:   Principal Problem:   Acute hypoxemic respiratory failure due to COVID-19 [facility]) Active Problems:   Pneumonia due to COVID-19 virus   Thrombocytopenia (HCC)   Acute respiratory failure due to COVID-19 [facility])   AKI  (acute kidney injury) (HCC)  Acute respiratory failure secondary to COVID-19 Multifocal pneumonia secondary to COVID-19 Patient has been dependent on heated high flow nasal cannula for several days We have been unable to wean He continues to mentate clearly with normal work of breathing Inflammatory markers overall downtrending Completed empiric antibiotic course Completed remdesivir course 9/9: Patient remains markedly hypoxic requiring heated high flow nasal cannula despite continued intervention Plan: Continue baricitinib Continue twice daily Solu-Medrol Stress proning Stress incentive spirometry and flutter use Supplemental vitamins Oxygen, wean as tolerated Lasix 40 mg IV x1, repeat as needed  Thrombocytopenia Unclear etiology, suspect reactive secondary to above Now resolved  Acute kidney injury Stable Creatinine at or near baseline Repeat IV Lasix  DVT prophylaxis: Lovenox Code Status: Full Family Communication: None today.  Offered to call family, patient declined Disposition Plan: Status is: Inpatient  Remains inpatient appropriate because:Inpatient level of care appropriate due to severity of illness   Dispo: The patient is from: Home              Anticipated d/c is to: Home              Anticipated d/c date is: > 3 days              Patient currently is not medically stable to d/c.   Patient remains markedly hypoxic, dependent on heated high flow nasal cannula.  Unable to wean thus far.  Prognosis guarded.        Consultants:   None  Procedures:   None  Antimicrobials:  None   Subjective: Seen and examined.  Continues  to endorse cough  Objective: Vitals:   09/04/20 0252 09/04/20 0755 09/04/20 1053 09/04/20 1210  BP:  103/80  117/82  Pulse:  77 65 80  Resp:  (!) 25 20 16   Temp:  98.2 F (36.8 C)  (!) 97.5 F (36.4 C)  TempSrc:  Oral  Axillary  SpO2:  94% 96% 90%  Weight: 79.4 kg     Height:        Intake/Output Summary (Last  24 hours) at 09/04/2020 1606 Last data filed at 09/04/2020 0700 Gross per 24 hour  Intake 600 ml  Output 1000 ml  Net -400 ml   Filed Weights   09/02/20 0547 09/03/20 0523 09/04/20 0252  Weight: 79.9 kg 80.5 kg 79.4 kg    Examination:  General: No apparent distress, patient appears well HEENT: Normocephalic, atraumatic Neck, supple, trachea midline, no tenderness Heart: Regular rate and rhythm, S1/S2 normal, no murmurs Lungs: Scattered coarse rales bilaterally.  Normal work of breathing.  55 L heated high Abdomen: Soft, nontender, nondistended, positive bowel sounds Extremities: Normal, atraumatic, no clubbing or cyanosis, normal muscle tone Skin: No rashes or lesions, normal color Neurologic: Cranial nerves grossly intact, sensation intact, alert and oriented x3 Psychiatric: Normal affect     Data Reviewed: I have personally reviewed following labs and imaging studies  CBC: Recent Labs  Lab 08/30/20 0607 08/31/20 0542 09/01/20 0658 09/02/20 0647 09/03/20 0446  WBC 2.5* 2.2* 3.2* 4.0 5.1  NEUTROABS 1.8 1.6* 2.3 3.2 4.5  HGB 12.7* 12.9* 13.3 14.5 13.9  HCT 37.1* 37.5* 39.0 42.1 40.4  MCV 92.8 92.4 92.0 91.7 91.8  PLT 130* 157 200 232 268   Basic Metabolic Panel: Recent Labs  Lab 08/29/20 0725 08/29/20 0725 08/30/20 0607 08/31/20 0542 09/01/20 0658 09/02/20 0647 09/03/20 0446  NA 140   < > 138 140 139 135 135  K 4.3   < > 4.5 4.6 4.5 4.8 5.1  CL 106   < > 105 106 104 101 99  CO2 23   < > 24 24 25 24 25   GLUCOSE 176*   < > 184* 189* 193* 201* 245*  BUN 44*   < > 41* 35* 34* 37* 34*  CREATININE 1.37*   < > 1.24 1.14 1.15 1.10 1.09  CALCIUM 9.2   < > 8.9 8.9 9.1 9.2 9.3  MG 2.6*  --  2.3 2.4 2.3 2.5*  --   PHOS 3.7  --  4.3 3.9 3.7 4.3  --    < > = values in this interval not displayed.   GFR: Estimated Creatinine Clearance: 71 mL/min (by C-G formula based on SCr of 1.09 mg/dL). Liver Function Tests: Recent Labs  Lab 08/29/20 0725 08/30/20 0607  08/31/20 0542 09/01/20 0658 09/02/20 0647  AST 58* 47* 34 23  --   ALT 30 31 31 25   --   ALKPHOS 43 39 39 40  --   BILITOT 0.7 0.7 0.6 0.7  --   PROT 7.3 7.0 6.8 7.1  --   ALBUMIN 3.3* 2.9* 2.9* 3.1* 3.2*   No results for input(s): LIPASE, AMYLASE in the last 168 hours. No results for input(s): AMMONIA in the last 168 hours. Coagulation Profile: No results for input(s): INR, PROTIME in the last 168 hours. Cardiac Enzymes: No results for input(s): CKTOTAL, CKMB, CKMBINDEX, TROPONINI in the last 168 hours. BNP (last 3 results) No results for input(s): PROBNP in the last 8760 hours. HbA1C: No results for input(s): HGBA1C in the last  72 hours. CBG: No results for input(s): GLUCAP in the last 168 hours. Lipid Profile: No results for input(s): CHOL, HDL, LDLCALC, TRIG, CHOLHDL, LDLDIRECT in the last 72 hours. Thyroid Function Tests: No results for input(s): TSH, T4TOTAL, FREET4, T3FREE, THYROIDAB in the last 72 hours. Anemia Panel: No results for input(s): VITAMINB12, FOLATE, FERRITIN, TIBC, IRON, RETICCTPCT in the last 72 hours. Sepsis Labs: Recent Labs  Lab 08/29/20 0725 08/30/20 0607 09/01/20 0658 09/02/20 0647  PROCALCITON 7.76 3.31 0.23 <0.10    Recent Results (from the past 240 hour(s))  Blood culture (routine single)     Status: None   Collection Time: 08/27/20 11:08 AM   Specimen: BLOOD  Result Value Ref Range Status   Specimen Description BLOOD RIGHT HAND  Final   Special Requests   Final    BOTTLES DRAWN AEROBIC AND ANAEROBIC Blood Culture adequate volume   Culture   Final    NO GROWTH 5 DAYS Performed at [facility], [address] [place], [place] [address]    Report Status 09/01/2020 FINAL  Final  SARS Coronavirus 2 by RT PCR (hospital order, performed in [facility] hospital lab) Nasopharyngeal Nasopharyngeal Swab     Status: Abnormal   Collection Time: 08/27/20 11:08 AM   Specimen: Nasopharyngeal Swab  Result Value Ref Range Status   SARS  Coronavirus 2 POSITIVE (A) NEGATIVE Final    Comment: RESULT CALLED TO, READ BACK BY AND VERIFIED WITH: MITCH BARKER AT 1310 ON 08/27/2020 [facility]. (NOTE) SARS-CoV-2 target nucleic acids are DETECTED  SARS-CoV-2 RNA is generally detectable in upper respiratory specimens  during the acute phase of infection.  Positive results are indicative  of the presence of the identified virus, but do not rule out bacterial infection or co-infection with other pathogens not detected by the test.  Clinical correlation with patient history and  other diagnostic information is necessary to determine patient infection status.  The expected result is negative.  Fact Sheet for Patients:   BoilerBrush.com.cy   Fact Sheet for Healthcare Providers:   https://pope.com/    This test is not yet approved or cleared by the [place] FDA and  has been authorized for detection and/or diagnosis of SARS-CoV-2 by FDA under an Emergency Use Authorization (EUA).  This EUA will remain in effect (meaning this  test can be used) for the duration of  the COVID-19 declaration under Section 564(b)(1) of the Act, 21 U.S.C. section 360-bbb-3(b)(1), unless the authorization is terminated or revoked sooner.  Performed at [facility], [address], [place], [place] [address]   Urine culture     Status: Abnormal   Collection Time: 08/27/20  3:52 PM   Specimen: In/Out Cath Urine  Result Value Ref Range Status   Specimen Description   Final    IN/OUT CATH URINE Performed at [facility], [address] [place], [place] [address]    Special Requests   Final    NONE Performed at [facility], [address] [place], [place] [address]    Culture MULTIPLE SPECIES PRESENT, SUGGEST RECOLLECTION (A)  Final   Report Status 08/29/2020 FINAL  Final         Radiology Studies: No results found.      Scheduled Meds: . vitamin C  500 mg Oral  Daily  . baricitinib  4 mg Oral Daily  . enoxaparin (LOVENOX) injection  40 mg Subcutaneous Q24H  . fluticasone  2 spray Each Nare Daily  . Ipratropium-Albuterol  2  puff Inhalation Q6H  . loratadine  10 mg Oral Daily  . methylPREDNISolone (SOLU-MEDROL) injection  80 mg Intravenous Q12H  . pantoprazole  40 mg Oral Q0600  . sodium chloride flush  3 mL Intravenous Q12H  . zinc sulfate  220 mg Oral Daily   Continuous Infusions: . sodium chloride 250 mL (09/02/20 1024)     LOS: 8 days    Time spent:25 minutes    Tresa Moore, MD [facility] Pager [phone]  If 7PM-7AM, please contact night-coverage 09/04/2020, 4:06 PM

## 2020-09-05 MED ORDER — FUROSEMIDE 10 MG/ML IJ SOLN
40.0000 mg | Freq: Once | INTRAMUSCULAR | Status: AC
  Administered 2020-09-05: 40 mg via INTRAVENOUS
  Filled 2020-09-05: qty 4

## 2020-09-05 NOTE — Progress Notes (Signed)
PROGRESS NOTE    Randy Ray  QIO:[id number] DOB: 1956/08/17 DOA: 08/27/2020 PCP: Patient, No Pcp Per   Brief Narrative:  HPI per Dr. Olive Bass E Crispis a 64 y.o.malewith medical historynosignificantpast medical historywhowas brought into theER by EMS for evaluation of fever,cough and shortness of breath. Patient stated that for over a week he has been experiencing intermittent fever, body aches and a dry cough. He also complained of poor appetite. Over the last 2 days he developed shortness of breath which has progressively worsened. He denies having any chest pains, no palpitations, no diaphoresis, no nausea, no vomiting or any changes in his bowel habits. When EMS arrived patient was found to have room air pulse oximetry of 76% and was placed on a nonrebreather mask with O2 at 10 L improving his pulse oximetry to 93%. He also received bronchodilator therapy. Patient is unvaccinated and denied having any Covid exposure. Upon arrival to the ER he received Solu-Medrol125mg  IV x 1 dose since he was wheezing. Presents with chief complaint of breathing, creatinine 2.57, BNP 37.8 lactic acid 2.1, procalcitonin 13.5,white count 4.4 with a left shift. His SARScoronavirus 2 test is positive Chest x-ray reviewed by me shows bibasilar opacities consistent with viral pneumonia Twelve-lead EKG reviewed by me shows sinus tachycardia  9/8: Patient seen and examined.  Mentating clearly.  Normal work of breathing.  Remains on 55 L high flow nasal cannula at 65% FiO2  9/9: Patient seen and examined.  Continues to mentate clearly.  Little bit agitated this morning about continued hospital stay.  Remains on 55 L high flow nasal cannula at 65% FiO2.  9/10: Patient seen and examined.  Improving slightly from respiratory standpoint.  Titrated to 50 L high flow 55%.  Assessment & Plan:   Principal Problem:   Acute hypoxemic respiratory failure due to COVID-19 [facility]) Active Problems:    Pneumonia due to COVID-19 virus   Thrombocytopenia (HCC)   Acute respiratory failure due to COVID-19 [facility])   AKI (acute kidney injury) (HCC)  Acute respiratory failure secondary to COVID-19 Multifocal pneumonia secondary to COVID-19 Patient has been dependent on heated high flow nasal cannula for several days We have been unable to wean He continues to mentate clearly with normal work of breathing Inflammatory markers overall downtrending Completed empiric antibiotic course Completed remdesivir course 9/9: Patient remains markedly hypoxic requiring heated high flow nasal cannula despite continued intervention Plan: Continue baricitinib Continue twice daily Solu-Medrol Stress proning Stress incentive spirometry and flutter use Supplemental vitamins Oxygen, wean as tolerated Lasix 40 mg IV x1 today, reevaluate daily for diuretic need  Thrombocytopenia Unclear etiology, suspect reactive secondary to above Now resolved  Acute kidney injury Stable Creatinine at or near baseline Repeat IV Lasix  DVT prophylaxis: Lovenox Code Status: Full Family Communication: None today.  Offered to call family, patient declined Disposition Plan: Status is: Inpatient  Remains inpatient appropriate because:Inpatient level of care appropriate due to severity of illness   Dispo: The patient is from: Home              Anticipated d/c is to: Home              Anticipated d/c date is: > 3 days              Patient currently is not medically stable to d/c.   Patient remains markedly hypoxic, dependent on heated high flow nasal cannula.  Was able to wean slightly over the past 24 hours.  Prognosis remain  guarded        Consultants:   None  Procedures:   None  Antimicrobials:  None   Subjective: Seen and examined.  Endorsing some cough.  Again asks when he can go home  Objective: Vitals:   09/05/20 1125 09/05/20 1130 09/05/20 1135 09/05/20 1140  BP:    118/81  Pulse: 69 71 62  68  Resp: 20 (!) 24 18 (!) 22  Temp:    98.1 F (36.7 C)  TempSrc:    Oral  SpO2: (!) 88% (!) 86% 93% 91%  Weight:      Height:        Intake/Output Summary (Last 24 hours) at 09/05/2020 1511 Last data filed at 09/05/2020 1143 Gross per 24 hour  Intake 240 ml  Output 1725 ml  Net -1485 ml   Filed Weights   09/03/20 0523 09/04/20 0252 09/05/20 0547  Weight: 80.5 kg 79.4 kg 79.2 kg    Examination:  General: No apparent distress, patient appears well HEENT: Normocephalic, atraumatic Neck, supple, trachea midline, no tenderness Heart: Regular rate and rhythm, S1/S2 normal, no murmurs Lungs: Scattered coarse rales bilaterally.  Normal work of breathing.  55 L heated high Abdomen: Soft, nontender, nondistended, positive bowel sounds Extremities: Normal, atraumatic, no clubbing or cyanosis, normal muscle tone Skin: No rashes or lesions, normal color Neurologic: Cranial nerves grossly intact, sensation intact, alert and oriented x3 Psychiatric: Normal affect     Data Reviewed: I have personally reviewed following labs and imaging studies  CBC: Recent Labs  Lab 08/30/20 0607 08/31/20 0542 09/01/20 0658 09/02/20 0647 09/03/20 0446  WBC 2.5* 2.2* 3.2* 4.0 5.1  NEUTROABS 1.8 1.6* 2.3 3.2 4.5  HGB 12.7* 12.9* 13.3 14.5 13.9  HCT 37.1* 37.5* 39.0 42.1 40.4  MCV 92.8 92.4 92.0 91.7 91.8  PLT 130* 157 200 232 268   Basic Metabolic Panel: Recent Labs  Lab 08/30/20 0607 08/31/20 0542 09/01/20 0658 09/02/20 0647 09/03/20 0446  NA 138 140 139 135 135  K 4.5 4.6 4.5 4.8 5.1  CL 105 106 104 101 99  CO2 24 24 25 24 25   GLUCOSE 184* 189* 193* 201* 245*  BUN 41* 35* 34* 37* 34*  CREATININE 1.24 1.14 1.15 1.10 1.09  CALCIUM 8.9 8.9 9.1 9.2 9.3  MG 2.3 2.4 2.3 2.5*  --   PHOS 4.3 3.9 3.7 4.3  --    GFR: Estimated Creatinine Clearance: 65.7 mL/min (by C-G formula based on SCr of 1.09 mg/dL). Liver Function Tests: Recent Labs  Lab 08/30/20 0607 08/31/20 0542  09/01/20 0658 09/02/20 0647  AST 47* 34 23  --   ALT 31 31 25   --   ALKPHOS 39 39 40  --   BILITOT 0.7 0.6 0.7  --   PROT 7.0 6.8 7.1  --   ALBUMIN 2.9* 2.9* 3.1* 3.2*   No results for input(s): LIPASE, AMYLASE in the last 168 hours. No results for input(s): AMMONIA in the last 168 hours. Coagulation Profile: No results for input(s): INR, PROTIME in the last 168 hours. Cardiac Enzymes: No results for input(s): CKTOTAL, CKMB, CKMBINDEX, TROPONINI in the last 168 hours. BNP (last 3 results) No results for input(s): PROBNP in the last 8760 hours. HbA1C: No results for input(s): HGBA1C in the last 72 hours. CBG: No results for input(s): GLUCAP in the last 168 hours. Lipid Profile: No results for input(s): CHOL, HDL, LDLCALC, TRIG, CHOLHDL, LDLDIRECT in the last 72 hours. Thyroid Function Tests: No results for input(s):  TSH, T4TOTAL, FREET4, T3FREE, THYROIDAB in the last 72 hours. Anemia Panel: No results for input(s): VITAMINB12, FOLATE, FERRITIN, TIBC, IRON, RETICCTPCT in the last 72 hours. Sepsis Labs: Recent Labs  Lab 08/30/20 0607 09/01/20 0658 09/02/20 0647  PROCALCITON 3.31 0.23 <0.10    Recent Results (from the past 240 hour(s))  Blood culture (routine single)     Status: None   Collection Time: 08/27/20 11:08 AM   Specimen: BLOOD  Result Value Ref Range Status   Specimen Description BLOOD RIGHT HAND  Final   Special Requests   Final    BOTTLES DRAWN AEROBIC AND ANAEROBIC Blood Culture adequate volume   Culture   Final    NO GROWTH 5 DAYS Performed at [facility], [address] [place], [place] [address]    Report Status 09/01/2020 FINAL  Final  SARS Coronavirus 2 by RT PCR (hospital order, performed in [facility] hospital lab) Nasopharyngeal Nasopharyngeal Swab     Status: Abnormal   Collection Time: 08/27/20 11:08 AM   Specimen: Nasopharyngeal Swab  Result Value Ref Range Status   SARS Coronavirus 2 POSITIVE (A) NEGATIVE Final    Comment:  RESULT CALLED TO, READ BACK BY AND VERIFIED WITH: MITCH BARKER AT 1310 ON 08/27/2020 [facility]. (NOTE) SARS-CoV-2 target nucleic acids are DETECTED  SARS-CoV-2 RNA is generally detectable in upper respiratory specimens  during the acute phase of infection.  Positive results are indicative  of the presence of the identified virus, but do not rule out bacterial infection or co-infection with other pathogens not detected by the test.  Clinical correlation with patient history and  other diagnostic information is necessary to determine patient infection status.  The expected result is negative.  Fact Sheet for Patients:   BoilerBrush.com.cy   Fact Sheet for Healthcare Providers:   https://pope.com/    This test is not yet approved or cleared by the [place] FDA and  has been authorized for detection and/or diagnosis of SARS-CoV-2 by FDA under an Emergency Use Authorization (EUA).  This EUA will remain in effect (meaning this  test can be used) for the duration of  the COVID-19 declaration under Section 564(b)(1) of the Act, 21 U.S.C. section 360-bbb-3(b)(1), unless the authorization is terminated or revoked sooner.  Performed at [facility], [address], [place], [place] [address]   Urine culture     Status: Abnormal   Collection Time: 08/27/20  3:52 PM   Specimen: In/Out Cath Urine  Result Value Ref Range Status   Specimen Description   Final    IN/OUT CATH URINE Performed at [facility], [address], [place], [place] [address]    Special Requests   Final    NONE Performed at [facility], [address] [place], [place] [address]    Culture MULTIPLE SPECIES PRESENT, SUGGEST RECOLLECTION (A)  Final   Report Status 08/29/2020 FINAL  Final         Radiology Studies: No results found.      Scheduled Meds: . vitamin C  500 mg Oral Daily  . baricitinib  4 mg Oral Daily  . enoxaparin  (LOVENOX) injection  40 mg Subcutaneous Q24H  . fluticasone  2 spray Each Nare Daily  . Ipratropium-Albuterol  2 puff Inhalation Q6H  . loratadine  10 mg Oral Daily  . methylPREDNISolone (SOLU-MEDROL) injection  80 mg Intravenous Q12H  . pantoprazole  40 mg Oral Q0600  . sodium chloride flush  3 mL Intravenous Q12H  .  zinc sulfate  220 mg Oral Daily   Continuous Infusions: . sodium chloride 250 mL (09/02/20 1024)     LOS: 9 days    Time spent:25 minutes    Tresa Moore, MD [facility] Pager [phone]  If 7PM-7AM, please contact night-coverage 09/05/2020, 3:11 PM

## 2020-09-06 MED ORDER — FUROSEMIDE 10 MG/ML IJ SOLN
40.0000 mg | Freq: Once | INTRAMUSCULAR | Status: AC
  Administered 2020-09-06: 40 mg via INTRAVENOUS
  Filled 2020-09-06: qty 4

## 2020-09-06 MED ORDER — ALPRAZOLAM 0.25 MG PO TABS
0.2500 mg | ORAL_TABLET | Freq: Three times a day (TID) | ORAL | Status: DC | PRN
  Administered 2020-09-06 – 2020-09-10 (×2): 0.25 mg via ORAL
  Filled 2020-09-06 (×2): qty 1

## 2020-09-06 MED ORDER — METHYLPREDNISOLONE SODIUM SUCC 125 MG IJ SOLR
60.0000 mg | Freq: Two times a day (BID) | INTRAMUSCULAR | Status: DC
  Administered 2020-09-06: 60 mg via INTRAVENOUS
  Filled 2020-09-06: qty 2

## 2020-09-06 NOTE — Progress Notes (Signed)
Patient agitated and verbally upset/excitable during morning medication pass. He states "he wants to leave now, He is better." MD Georgeann Oppenheim is aware and patient verbalized same to him earlier this morning. Currently on HHFNC 40L at 40% Fi02. This RN educated patient about need of additional oxygen support. Patient does not respond to me when I tried to explain to him. I repositioned patient in bed and provided him more pillows. I sat down beside bed in chair to try and discuss patient's concerns. Patient very angry about being in hospital, tired of being poked for blood and says people only look at him when assessing him. This RN listened to lung sounds, assessed skin and pulses, no  bathroom needs during my time in room. This RN's assessment documented in flowsheet. Patient was resting in bed when I exited room watching TV. I will continue to assess and try to provide patient with comforting measures to help relax him and continue to try and educate about his current health state to the best of my ability. I will notify MD Sreenath at time of new lab results per his request.

## 2020-09-06 NOTE — Progress Notes (Signed)
Administered ordered lasix to patient. Patient very angry and verbally abusive to this RN with shouting profanities. He says we have not done anything for him while he has been here. Wants his papers to leave. Notified MD Sreenath. Will confer with CN Shade Flood to see if she can help calm him down. Will continue to assess.

## 2020-09-06 NOTE — Progress Notes (Signed)
This RN attempted to return call to Marylene Land per her request with status update on patient. No on answered and mailbox full. I will try again this afternoon.

## 2020-09-06 NOTE — Progress Notes (Signed)
PROGRESS NOTE    JENNIFER HOLLAND  XIP:[id number] DOB: 06-Feb-1956 DOA: 08/27/2020 PCP: Patient, No Pcp Per   Brief Narrative:  HPI per Dr. Olive Bass E Crispis a 64 y.o.malewith medical historynosignificantpast medical historywhowas brought into theER by EMS for evaluation of fever,cough and shortness of breath. Patient stated that for over a week he has been experiencing intermittent fever, body aches and a dry cough. He also complained of poor appetite. Over the last 2 days he developed shortness of breath which has progressively worsened. He denies having any chest pains, no palpitations, no diaphoresis, no nausea, no vomiting or any changes in his bowel habits. When EMS arrived patient was found to have room air pulse oximetry of 76% and was placed on a nonrebreather mask with O2 at 10 L improving his pulse oximetry to 93%. He also received bronchodilator therapy. Patient is unvaccinated and denied having any Covid exposure. Upon arrival to the ER he received Solu-Medrol125mg  IV x 1 dose since he was wheezing. Presents with chief complaint of breathing, creatinine 2.57, BNP 37.8 lactic acid 2.1, procalcitonin 13.5,white count 4.4 with a left shift. His SARScoronavirus 2 test is positive Chest x-ray reviewed by me shows bibasilar opacities consistent with viral pneumonia Twelve-lead EKG reviewed by me shows sinus tachycardia  9/8: Patient seen and examined.  Mentating clearly.  Normal work of breathing.  Remains on 55 L high flow nasal cannula at 65% FiO2  9/9: Patient seen and examined.  Continues to mentate clearly.  Little bit agitated this morning about continued hospital stay.  Remains on 55 L high flow nasal cannula at 65% FiO2.  9/10: Patient seen and examined.  Improving slightly from respiratory standpoint.  Titrated to 50 L high flow 55%.  9/11: Patient seen and examined.  Very angry this morning.  States that his breathing is better and he wants to leave.  I had  a lengthy discussion with him about his level of hypoxia.  He is currently on 40 L heated high flow nasal cannula at 40% FiO2.  Stable at this level of oxygen requirement if we are to remove it he will likely die.  I strongly encouraged him to stay in the hospital and not leave.  RN spoke to him as well and reiterated the message.  Assessment & Plan:   Principal Problem:   Acute hypoxemic respiratory failure due to COVID-19 [facility]) Active Problems:   Pneumonia due to COVID-19 virus   Thrombocytopenia (HCC)   Acute respiratory failure due to COVID-19 [facility])   AKI (acute kidney injury) (HCC)  Acute respiratory failure secondary to COVID-19 Multifocal pneumonia secondary to COVID-19 Patient has been dependent on heated high flow nasal cannula for several days We have been unable to wean He continues to mentate clearly with normal work of breathing Inflammatory markers overall downtrending Completed empiric antibiotic course Completed remdesivir course 9/9: Patient remains markedly hypoxic requiring heated high flow nasal cannula despite continued intervention 9/11: Patient continues to make daily progress however is getting increasingly frustrated about continued hospital stay.  He keeps saying he wants to leave now and his breathing is better.  Despite my explanations he continues to express frustration. Plan: Continue baricitinib Continue twice daily Solu-Medrol Stress proning Stress incentive spirometry and flutter use Supplemental vitamins Oxygen, wean as tolerated Lasix 40 mg IV x1 today, reevaluate daily for diuretic need Patient is very upset today.  Will DC daily lab draws as they have been consistently improving.  Check labs as needed if  clinical situation worsens  Thrombocytopenia Unclear etiology, suspect reactive secondary to above Now resolved  Acute kidney injury Stable Creatinine at or near baseline Repeat IV Lasix  DVT prophylaxis: Lovenox Code Status: Full Family  Communication: None today.  Offered to call family, patient declined Disposition Plan: Status is: Inpatient  Remains inpatient appropriate because:Inpatient level of care appropriate due to severity of illness   Dispo: The patient is from: Home              Anticipated d/c is to: Home              Anticipated d/c date is: > 3 days              Patient currently is not medically stable to d/c.   Patient remains markedly hypoxic.  Dependent on heated high flow nasal cannula.  We are progressively weaning.  Currently on 40 L at 40%.  Patient expressed frustration and wants to leave.  This despite my warnings that he would likely die if he was leave the hospital with this level of hypoxia.  Consultants:   None  Procedures:   None  Antimicrobials:  None   Subjective: Seen and examined.  Angered about continued hospital stay  Objective: Vitals:   09/06/20 0430 09/06/20 0507 09/06/20 0800 09/06/20 0906  BP:    (!) 132/92  Pulse: 65 (!) 59 61 67  Resp: 18 18    Temp:      TempSrc:    Oral  SpO2: 96% 94%  95%  Weight:  76.2 kg    Height:        Intake/Output Summary (Last 24 hours) at 09/06/2020 1150 Last data filed at 09/06/2020 0530 Gross per 24 hour  Intake 360 ml  Output 1100 ml  Net -740 ml   Filed Weights   09/04/20 0252 09/05/20 0547 09/06/20 0507  Weight: 79.4 kg 79.2 kg 76.2 kg    Examination:  General: Angry, frustrated HEENT: Normocephalic, atraumatic Neck, supple, trachea midline, no tenderness Heart: Regular rate and rhythm, S1/S2 normal, no murmurs Lungs: Diffuse crackles bilaterally.  Normal work of breathing.  40 L heated high flow Abdomen: Soft, nontender, nondistended, positive bowel sounds Extremities: Normal, atraumatic, no clubbing or cyanosis, normal muscle tone Skin: No rashes or lesions, normal color Neurologic: Cranial nerves grossly intact, sensation intact, alert and oriented x3 Psychiatric: Agitated affect.  Poor judgment and insight  into disease process      Data Reviewed: I have personally reviewed following labs and imaging studies  CBC: Recent Labs  Lab 08/31/20 0542 09/01/20 0658 09/02/20 0647 09/03/20 0446  WBC 2.2* 3.2* 4.0 5.1  NEUTROABS 1.6* 2.3 3.2 4.5  HGB 12.9* 13.3 14.5 13.9  HCT 37.5* 39.0 42.1 40.4  MCV 92.4 92.0 91.7 91.8  PLT 157 200 232 268   Basic Metabolic Panel: Recent Labs  Lab 08/31/20 0542 09/01/20 0658 09/02/20 0647 09/03/20 0446  NA 140 139 135 135  K 4.6 4.5 4.8 5.1  CL 106 104 101 99  CO2 24 25 24 25   GLUCOSE 189* 193* 201* 245*  BUN 35* 34* 37* 34*  CREATININE 1.14 1.15 1.10 1.09  CALCIUM 8.9 9.1 9.2 9.3  MG 2.4 2.3 2.5*  --   PHOS 3.9 3.7 4.3  --    GFR: Estimated Creatinine Clearance: 65.7 mL/min (by C-G formula based on SCr of 1.09 mg/dL). Liver Function Tests: Recent Labs  Lab 08/31/20 0542 09/01/20 0658 09/02/20 0647  AST 34 23  --  ALT 31 25  --   ALKPHOS 39 40  --   BILITOT 0.6 0.7  --   PROT 6.8 7.1  --   ALBUMIN 2.9* 3.1* 3.2*   No results for input(s): LIPASE, AMYLASE in the last 168 hours. No results for input(s): AMMONIA in the last 168 hours. Coagulation Profile: No results for input(s): INR, PROTIME in the last 168 hours. Cardiac Enzymes: No results for input(s): CKTOTAL, CKMB, CKMBINDEX, TROPONINI in the last 168 hours. BNP (last 3 results) No results for input(s): PROBNP in the last 8760 hours. HbA1C: No results for input(s): HGBA1C in the last 72 hours. CBG: No results for input(s): GLUCAP in the last 168 hours. Lipid Profile: No results for input(s): CHOL, HDL, LDLCALC, TRIG, CHOLHDL, LDLDIRECT in the last 72 hours. Thyroid Function Tests: No results for input(s): TSH, T4TOTAL, FREET4, T3FREE, THYROIDAB in the last 72 hours. Anemia Panel: No results for input(s): VITAMINB12, FOLATE, FERRITIN, TIBC, IRON, RETICCTPCT in the last 72 hours. Sepsis Labs: Recent Labs  Lab 09/01/20 0658 09/02/20 0647  PROCALCITON 0.23 <0.10     Recent Results (from the past 240 hour(s))  Urine culture     Status: Abnormal   Collection Time: 08/27/20  3:52 PM   Specimen: In/Out Cath Urine  Result Value Ref Range Status   Specimen Description   Final    IN/OUT CATH URINE Performed at [facility], [address], [place], [place] [address]    Special Requests   Final    NONE Performed at [facility] [address] [place], [place] [address]    Culture MULTIPLE SPECIES PRESENT, SUGGEST RECOLLECTION (A)  Final   Report Status 08/29/2020 FINAL  Final         Radiology Studies: No results found.      Scheduled Meds: . vitamin C  500 mg Oral Daily  . baricitinib  4 mg Oral Daily  . enoxaparin (LOVENOX) injection  40 mg Subcutaneous Q24H  . fluticasone  2 spray Each Nare Daily  . furosemide  40 mg Intravenous Once  . Ipratropium-Albuterol  2 puff Inhalation Q6H  . loratadine  10 mg Oral Daily  . methylPREDNISolone (SOLU-MEDROL) injection  80 mg Intravenous Q12H  . pantoprazole  40 mg Oral Q0600  . sodium chloride flush  3 mL Intravenous Q12H  . zinc sulfate  220 mg Oral Daily   Continuous Infusions: . sodium chloride 250 mL (09/02/20 1024)     LOS: 10 days    Time spent:25 minutes    Tresa Moore, MD [facility] Pager [phone]  If 7PM-7AM, please contact night-coverage 09/06/2020, 11:50 AM

## 2020-09-06 NOTE — Progress Notes (Signed)
This RN, MD Georgeann Oppenheim and CN Shade Flood went to bedside. MD informed patient he would not live without oxyge, explained the treatments he has received so far etc. Patient stated we are not to contact his wife. Very angry. Not demanding to leave at this time but not pleased to remain here. Will continue to assess. Staying on HFNC 40L at 40%

## 2020-09-06 NOTE — Care Management (Addendum)
Received call from bedside nurse this afternoon the patient was agitated and angry, verbally abusive to RN, stating that "we are doing nothing for him" and that he wanted to leave.  Myself, charge nurse Judeth Cornfield, bedside nurse Sheria Lang when spoke to the patient at bedside.  I explained that patient's respiratory status is improving although slowly.  He has received a complete course of remdesivir, continues on baricitinib, receiving IV steroids, receiving high flow oxygen.  I explained that his oxygen requirement is still far too high to consider a safe discharge and should he decide to leave the hospital it would be AGAINST MEDICAL ADVICE.  I strongly recommended against this as should he leave the hospital he likely would not survive long.  He has marked hypoxemia secondary to COVID-19.  I asked who the patient lived with and he told me his wife.  I requested again to call her and he said "nope, no one talks to my wife but me".  I offered him an antianxiety medication and he refused.  At this time the conversation ended.  He was not immediately demanding to leave.  Patient is a very high risk for respiratory decompensation and death should he leave AGAINST MEDICAL ADVICE.  Lolita Patella MD

## 2020-09-07 MED ORDER — METHYLPREDNISOLONE SODIUM SUCC 40 MG IJ SOLR
40.0000 mg | Freq: Two times a day (BID) | INTRAMUSCULAR | Status: DC
  Administered 2020-09-07 – 2020-09-10 (×6): 40 mg via INTRAVENOUS
  Filled 2020-09-07 (×6): qty 1

## 2020-09-07 NOTE — Progress Notes (Signed)
Patient presently resting in the bed, alert and oriented, denies any pain remaisn on HHFNC at 40L at 40% tolerating, patient refusing AM care, didn t want to use Incentive spirometry  York Spaniel he has been using it but his condition still the same ,patient was educated .

## 2020-09-07 NOTE — Progress Notes (Signed)
PROGRESS NOTE    Randy Ray  KKX:[id number] DOB: [birth year] DOA: 08/27/2020 PCP: Patient, No Pcp Per   Brief Narrative:  HPI per Dr. Olive Bass E Crispis a 64 y.o.malewith medical historynosignificantpast medical historywhowas brought into theER by EMS for evaluation of fever,cough and shortness of breath. Patient stated that for over a week he has been experiencing intermittent fever, body aches and a dry cough. He also complained of poor appetite. Over the last 2 days he developed shortness of breath which has progressively worsened. He denies having any chest pains, no palpitations, no diaphoresis, no nausea, no vomiting or any changes in his bowel habits. When EMS arrived patient was found to have room air pulse oximetry of 76% and was placed on a nonrebreather mask with O2 at 10 L improving his pulse oximetry to 93%. He also received bronchodilator therapy. Patient is unvaccinated and denied having any Covid exposure. Upon arrival to the ER he received Solu-Medrol125mg  IV x 1 dose since he was wheezing. Presents with chief complaint of breathing, creatinine 2.57, BNP 37.8 lactic acid 2.1, procalcitonin 13.5,white count 4.4 with a left shift. His SARScoronavirus 2 test is positive Chest x-ray reviewed by me shows bibasilar opacities consistent with viral pneumonia Twelve-lead EKG reviewed by me shows sinus tachycardia  9/8: Patient seen and examined.  Mentating clearly.  Normal work of breathing.  Remains on 55 L high flow nasal cannula at 65% FiO2  9/9: Patient seen and examined.  Continues to mentate clearly.  Little bit agitated this morning about continued hospital stay.  Remains on 55 L high flow nasal cannula at 65% FiO2.  9/10: Patient seen and examined.  Improving slightly from respiratory standpoint.  Titrated to 50 L high flow 55%.  9/11: Patient seen and examined.  Very angry this morning.  States that his breathing is better and he wants to leave.  I had  a lengthy discussion with him about his level of hypoxia.  He is currently on 40 L heated high flow nasal cannula at 40% FiO2.  Stable at this level of oxygen requirement if we are to remove it he will likely die.  I strongly encouraged him to stay in the hospital and not leave.  RN spoke to him as well and reiterated the message.  9/12: Patient seen and examined.  Much more calm this morning.  Continues to make daily improvements.  Titrated down to 30 L heated high flow nasal cannula.  Assessment & Plan:   Principal Problem:   Acute hypoxemic respiratory failure due to COVID-19 [facility]) Active Problems:   Pneumonia due to COVID-19 virus   Thrombocytopenia (HCC)   Acute respiratory failure due to COVID-19 [facility])   AKI (acute kidney injury) (HCC)  Acute respiratory failure secondary to COVID-19 Multifocal pneumonia secondary to COVID-19 Patient has been dependent on heated high flow nasal cannula for several days We have been unable to wean He continues to mentate clearly with normal work of breathing Inflammatory markers overall downtrending Completed empiric antibiotic course Completed remdesivir course 9/9: Patient remains markedly hypoxic requiring heated high flow nasal cannula despite continued intervention 9/11: Patient continues to make daily progress however is getting increasingly frustrated about continued hospital stay.  He keeps saying he wants to leave now and his breathing is better.  Despite my explanations he continues to express frustration. 9/12: Much more calm this morning.  Not trying to leave Plan: Continue baricitinib Continue twice daily Solu-Medrol, dose decreased Stress proning Stress incentive spirometry and  flutter use Supplemental vitamins Oxygen, wean as tolerated Hold off on Lasix today  Thrombocytopenia Unclear etiology, suspect reactive secondary to above Now resolved  Acute kidney injury Stable Creatinine at or near baseline Repeat IV Lasix  DVT  prophylaxis: Lovenox Code Status: Full Family Communication: Spouse Marylene Land via phone [phone] disposition Plan: Status is: Inpatient  Remains inpatient appropriate because:Inpatient level of care appropriate due to severity of illness   Dispo: The patient is from: Home              Anticipated d/c is to: Home              Anticipated d/c date is: > 3 days              Patient currently is not medically stable to d/c.   Patient remains hypoxic dependent on heated high flow nasal cannula.  Making progress.  Down to 30 L.        Consultants:   None  Procedures:   None  Antimicrobials:  None   Subjective: Seen and examined.  A little calmer this morning.  Not wanting to leave  Objective: Vitals:   09/07/20 0812 09/07/20 1000 09/07/20 1200 09/07/20 1224  BP: 119/83  138/84 138/85  Pulse: 65 68 77 73  Resp: 20 20 (!) 26 (!) 25  Temp: 97.8 F (36.6 C)  97.9 F (36.6 C) 97.8 F (36.6 C)  TempSrc: Oral  Oral   SpO2: 98% 93% (!) 87% 96%  Weight:      Height:        Intake/Output Summary (Last 24 hours) at 09/07/2020 1346 Last data filed at 09/07/2020 0813 Gross per 24 hour  Intake 360 ml  Output 2025 ml  Net -1665 ml   Filed Weights   09/05/20 0547 09/06/20 0507 09/07/20 0300  Weight: 79.2 kg 76.2 kg 79.3 kg    Examination:  General: Angry, frustrated HEENT: Normocephalic, atraumatic Neck, supple, trachea midline, no tenderness Heart: Regular rate and rhythm, S1/S2 normal, no murmurs Lungs: Bilateral diffuse crackles.  Normal work of breathing.  30 L heated high flow Abdomen: Soft, nontender, nondistended, positive bowel sounds Extremities: Normal, atraumatic, no clubbing or cyanosis, normal muscle tone Skin: No rashes or lesions, normal color Neurologic: Cranial nerves grossly intact, sensation intact, alert and oriented x3 Psychiatric: Agitated affect.  Poor judgment and insight into disease process      Data Reviewed: I have personally  reviewed following labs and imaging studies  CBC: Recent Labs  Lab 09/01/20 0658 09/02/20 0647 09/03/20 0446  WBC 3.2* 4.0 5.1  NEUTROABS 2.3 3.2 4.5  HGB 13.3 14.5 13.9  HCT 39.0 42.1 40.4  MCV 92.0 91.7 91.8  PLT 200 232 268   Basic Metabolic Panel: Recent Labs  Lab 09/01/20 0658 09/02/20 0647 09/03/20 0446  NA 139 135 135  K 4.5 4.8 5.1  CL 104 101 99  CO2 25 24 25   GLUCOSE 193* 201* 245*  BUN 34* 37* 34*  CREATININE 1.15 1.10 1.09  CALCIUM 9.1 9.2 9.3  MG 2.3 2.5*  --   PHOS 3.7 4.3  --    GFR: Estimated Creatinine Clearance: 65.7 mL/min (by C-G formula based on SCr of 1.09 mg/dL). Liver Function Tests: Recent Labs  Lab 09/01/20 0658 09/02/20 0647  AST 23  --   ALT 25  --   ALKPHOS 40  --   BILITOT 0.7  --   PROT 7.1  --   ALBUMIN 3.1* 3.2*  No results for input(s): LIPASE, AMYLASE in the last 168 hours. No results for input(s): AMMONIA in the last 168 hours. Coagulation Profile: No results for input(s): INR, PROTIME in the last 168 hours. Cardiac Enzymes: No results for input(s): CKTOTAL, CKMB, CKMBINDEX, TROPONINI in the last 168 hours. BNP (last 3 results) No results for input(s): PROBNP in the last 8760 hours. HbA1C: No results for input(s): HGBA1C in the last 72 hours. CBG: No results for input(s): GLUCAP in the last 168 hours. Lipid Profile: No results for input(s): CHOL, HDL, LDLCALC, TRIG, CHOLHDL, LDLDIRECT in the last 72 hours. Thyroid Function Tests: No results for input(s): TSH, T4TOTAL, FREET4, T3FREE, THYROIDAB in the last 72 hours. Anemia Panel: No results for input(s): VITAMINB12, FOLATE, FERRITIN, TIBC, IRON, RETICCTPCT in the last 72 hours. Sepsis Labs: Recent Labs  Lab 09/01/20 0658 09/02/20 0647  PROCALCITON 0.23 <0.10    No results found for this or any previous visit (from the past 240 hour(s)).       Radiology Studies: No results found.      Scheduled Meds: . vitamin C  500 mg Oral Daily  . baricitinib   4 mg Oral Daily  . enoxaparin (LOVENOX) injection  40 mg Subcutaneous Q24H  . fluticasone  2 spray Each Nare Daily  . Ipratropium-Albuterol  2 puff Inhalation Q6H  . loratadine  10 mg Oral Daily  . methylPREDNISolone (SOLU-MEDROL) injection  40 mg Intravenous Q12H  . pantoprazole  40 mg Oral Q0600  . sodium chloride flush  3 mL Intravenous Q12H  . zinc sulfate  220 mg Oral Daily   Continuous Infusions: . sodium chloride 250 mL (09/02/20 1024)     LOS: 11 days    Time spent:25 minutes    Tresa Moore, MD [facility] Pager [phone]  If 7PM-7AM, please contact night-coverage 09/07/2020, 1:46 PM

## 2020-09-08 MED ORDER — FUROSEMIDE 10 MG/ML IJ SOLN
40.0000 mg | Freq: Once | INTRAMUSCULAR | Status: AC
  Administered 2020-09-08: 40 mg via INTRAVENOUS
  Filled 2020-09-08: qty 4

## 2020-09-08 NOTE — Progress Notes (Signed)
PROGRESS NOTE    Randy Ray  LAG:[id number] DOB: 05/11/1956 DOA: 08/27/2020 PCP: Patient, No Pcp Per   Brief Narrative:  HPI per Dr. Olive Bass E Crispis a 64 y.o.malewith medical historynosignificantpast medical historywhowas brought into theER by EMS for evaluation of fever,cough and shortness of breath. Patient stated that for over a week he has been experiencing intermittent fever, body aches and a dry cough. He also complained of poor appetite. Over the last 2 days he developed shortness of breath which has progressively worsened. He denies having any chest pains, no palpitations, no diaphoresis, no nausea, no vomiting or any changes in his bowel habits. When EMS arrived patient was found to have room air pulse oximetry of 76% and was placed on a nonrebreather mask with O2 at 10 L improving his pulse oximetry to 93%. He also received bronchodilator therapy. Patient is unvaccinated and denied having any Covid exposure. Upon arrival to the ER he received Solu-Medrol125mg  IV x 1 dose since he was wheezing. Presents with chief complaint of breathing, creatinine 2.57, BNP 37.8 lactic acid 2.1, procalcitonin 13.5,white count 4.4 with a left shift. His SARScoronavirus 2 test is positive Chest x-ray reviewed by me shows bibasilar opacities consistent with viral pneumonia Twelve-lead EKG reviewed by me shows sinus tachycardia  9/8: Patient seen and examined.  Mentating clearly.  Normal work of breathing.  Remains on 55 L high flow nasal cannula at 65% FiO2  9/9: Patient seen and examined.  Continues to mentate clearly.  Little bit agitated this morning about continued hospital stay.  Remains on 55 L high flow nasal cannula at 65% FiO2.  9/10: Patient seen and examined.  Improving slightly from respiratory standpoint.  Titrated to 50 L high flow 55%.  9/11: Patient seen and examined.  Very angry this morning.  States that his breathing is better and he wants to leave.  I had  a lengthy discussion with him about his level of hypoxia.  He is currently on 40 L heated high flow nasal cannula at 40% FiO2.  Stable at this level of oxygen requirement if we are to remove it he will likely die.  I strongly encouraged him to stay in the hospital and not leave.  RN spoke to him as well and reiterated the message.  9/12: Patient seen and examined.  Much more calm this morning.  Continues to make daily improvements.  Titrated down to 30 L heated high flow nasal cannula.  9/13: Patient seen and examined.  Again asks when he can leave the hospital.  Continues to be dependent on heated high flow nasal cannula.  40 L at 40% heated high flow   Assessment & Plan:   Principal Problem:   Acute hypoxemic respiratory failure due to COVID-19 [facility]) Active Problems:   Pneumonia due to COVID-19 virus   Thrombocytopenia (HCC)   Acute respiratory failure due to COVID-19 [facility])   AKI (acute kidney injury) (HCC)  Acute respiratory failure secondary to COVID-19 Multifocal pneumonia secondary to COVID-19 Patient has been dependent on heated high flow nasal cannula for several days We have been unable to wean He continues to mentate clearly with normal work of breathing Inflammatory markers overall downtrending Completed empiric antibiotic course Completed remdesivir course 9/9: Patient remains markedly hypoxic requiring heated high flow nasal cannula despite continued intervention 9/11: Patient continues to make daily progress however is getting increasingly frustrated about continued hospital stay.  He keeps saying he wants to leave now and his breathing is better.  Despite my explanations he continues to express frustration. 9/12: Much more calm this morning.  Not trying to leave Plan: Continue baricitinib Continue twice daily Solu-Medrol, dose decreased Stress proning Stress incentive spirometry and flutter use Supplemental vitamins Oxygen, wean as tolerated Lasix 40 mg IV x 1.   Re-evaluate daily Daily labs discontinued.  Patient was getting very angry and agitated about repeated blood sticks.  Told him that we would discontinue standing labs but will need to get them to his clinical situation deteriorated.  Thrombocytopenia Unclear etiology, suspect reactive secondary to above Now resolved  Acute kidney injury Stable Creatinine at or near baseline Repeat IV Lasix  DVT prophylaxis: Lovenox Code Status: Full Family Communication: Spouse Angela via phone [phone] on 9/12 disposition Plan: Status is: Inpatient  Remains inpatient appropriate because:Inpatient level of care appropriate due to severity of illness   Dispo: The patient is from: Home              Anticipated d/c is to: Home              Anticipated d/c date is: > 3 days              Patient currently is not medically stable to d/c.   Patient remains hypoxic.  Dependent on heated high flow nasal cannula.  Consultants:   None  Procedures:   None  Antimicrobials:  None   Subjective: Seen and examined.  Repeatedly asking when he can go home.  Objective: Vitals:   09/07/20 2035 09/08/20 0400 09/08/20 0500 09/08/20 0739  BP: 119/74 112/76 118/86 124/85  Pulse: 80 71 73 72  Resp: (!) 21  20 20   Temp: 97.8 F (36.6 C)  97.8 F (36.6 C) 97.9 F (36.6 C)  TempSrc: Oral  Axillary Oral  SpO2: (!) 89% (!) 88% 92% 93%  Weight:      Height:        Intake/Output Summary (Last 24 hours) at 09/08/2020 1124 Last data filed at 09/08/2020 1005 Gross per 24 hour  Intake --  Output 1200 ml  Net -1200 ml   Filed Weights   09/05/20 0547 09/06/20 0507 09/07/20 0300  Weight: 79.2 kg 76.2 kg 79.3 kg    Examination:  General: No apparent distress, patient appears fatigued HEENT: Normocephalic, atraumatic Neck, supple, trachea midline, no tenderness Heart: Regular rate and rhythm, S1/S2 normal, no murmurs Lungs: Bilateral scattered crackles.  Normal work of breathing.  40 L heated  high flow Abdomen: Soft, nontender, nondistended, positive bowel sounds Extremities: Normal, atraumatic, no clubbing or cyanosis, normal muscle tone Skin: No rashes or lesions, normal color Neurologic: Cranial nerves grossly intact, sensation intact, alert and oriented x3 Psychiatric: Normal affect      Data Reviewed: I have personally reviewed following labs and imaging studies  CBC: Recent Labs  Lab 09/02/20 0647 09/03/20 0446  WBC 4.0 5.1  NEUTROABS 3.2 4.5  HGB 14.5 13.9  HCT 42.1 40.4  MCV 91.7 91.8  PLT 232 268   Basic Metabolic Panel: Recent Labs  Lab 09/02/20 0647 09/03/20 0446  NA 135 135  K 4.8 5.1  CL 101 99  CO2 24 25  GLUCOSE 201* 245*  BUN 37* 34*  CREATININE 1.10 1.09  CALCIUM 9.2 9.3  MG 2.5*  --   PHOS 4.3  --    GFR: Estimated Creatinine Clearance: 65.7 mL/min (by C-G formula based on SCr of 1.09 mg/dL). Liver Function Tests: Recent Labs  Lab 09/02/20 0647  ALBUMIN 3.2*  No results for input(s): LIPASE, AMYLASE in the last 168 hours. No results for input(s): AMMONIA in the last 168 hours. Coagulation Profile: No results for input(s): INR, PROTIME in the last 168 hours. Cardiac Enzymes: No results for input(s): CKTOTAL, CKMB, CKMBINDEX, TROPONINI in the last 168 hours. BNP (last 3 results) No results for input(s): PROBNP in the last 8760 hours. HbA1C: No results for input(s): HGBA1C in the last 72 hours. CBG: No results for input(s): GLUCAP in the last 168 hours. Lipid Profile: No results for input(s): CHOL, HDL, LDLCALC, TRIG, CHOLHDL, LDLDIRECT in the last 72 hours. Thyroid Function Tests: No results for input(s): TSH, T4TOTAL, FREET4, T3FREE, THYROIDAB in the last 72 hours. Anemia Panel: No results for input(s): VITAMINB12, FOLATE, FERRITIN, TIBC, IRON, RETICCTPCT in the last 72 hours. Sepsis Labs: Recent Labs  Lab 09/02/20 0647  PROCALCITON <0.10    No results found for this or any previous visit (from the past 240  hour(s)).       Radiology Studies: No results found.      Scheduled Meds: . vitamin C  500 mg Oral Daily  . baricitinib  4 mg Oral Daily  . enoxaparin (LOVENOX) injection  40 mg Subcutaneous Q24H  . fluticasone  2 spray Each Nare Daily  . Ipratropium-Albuterol  2 puff Inhalation Q6H  . loratadine  10 mg Oral Daily  . methylPREDNISolone (SOLU-MEDROL) injection  40 mg Intravenous Q12H  . pantoprazole  40 mg Oral Q0600  . sodium chloride flush  3 mL Intravenous Q12H  . zinc sulfate  220 mg Oral Daily   Continuous Infusions: . sodium chloride 250 mL (09/02/20 1024)     LOS: 12 days    Time spent:25 minutes    Tresa Moore, MD [facility] Pager [phone]  If 7PM-7AM, please contact night-coverage 09/08/2020, 11:24 AM

## 2020-09-08 NOTE — Plan of Care (Addendum)
Patient resting in the bed , remains on HHFNC 40l at 40% tolerating setting, noted patient to be using incentive spirometry frequently    Problem: Education: Goal: Verbalization of understanding the information provided will improve Outcome: Progressing   Problem: Activity: Goal: Interest or engagement in activities will improve Outcome: Progressing   Problem: Clinical Measurements: Goal: Respiratory complications will improve Outcome: Progressing Goal: Cardiovascular complication will be avoided Outcome: Progressing

## 2020-09-09 ENCOUNTER — Encounter: Payer: Self-pay | Admitting: Internal Medicine

## 2020-09-09 ENCOUNTER — Inpatient Hospital Stay: Payer: HRSA Program

## 2020-09-09 MED ORDER — IOHEXOL 300 MG/ML  SOLN: 100.0000 mL | Freq: Once | INTRAMUSCULAR | Status: DC | PRN

## 2020-09-09 MED ORDER — IOHEXOL 350 MG/ML SOLN
100.0000 mL | Freq: Once | INTRAVENOUS | Status: AC | PRN
  Administered 2020-09-09: 100 mL via INTRAVENOUS

## 2020-09-09 MED ORDER — FUROSEMIDE 10 MG/ML IJ SOLN
40.0000 mg | Freq: Once | INTRAMUSCULAR | Status: AC
  Administered 2020-09-09: 40 mg via INTRAVENOUS
  Filled 2020-09-09: qty 4

## 2020-09-09 NOTE — Progress Notes (Signed)
Patient OOB sitting in the chair , denies any pain or SOB, oxygen 15 L [place] tolerating setting , patient using incentive spirometry frequently  as per MD recommendation .

## 2020-09-09 NOTE — Progress Notes (Signed)
PROGRESS NOTE    Randy Ray  VOZ:[id number] DOB: 1956/11/29 DOA: 08/27/2020 PCP: Patient, No Pcp Per   Brief Narrative:  HPI per Dr. Olive Bass E Crispis a 64 y.o.malewith medical historynosignificantpast medical historywhowas brought into theER by EMS for evaluation of fever,cough and shortness of breath. Patient stated that for over a week he has been experiencing intermittent fever, body aches and a dry cough. He also complained of poor appetite. Over the last 2 days he developed shortness of breath which has progressively worsened. He denies having any chest pains, no palpitations, no diaphoresis, no nausea, no vomiting or any changes in his bowel habits. When EMS arrived patient was found to have room air pulse oximetry of 76% and was placed on a nonrebreather mask with O2 at 10 L improving his pulse oximetry to 93%. He also received bronchodilator therapy. Patient is unvaccinated and denied having any Covid exposure. Upon arrival to the ER he received Solu-Medrol125mg  IV x 1 dose since he was wheezing. Presents with chief complaint of breathing, creatinine 2.57, BNP 37.8 lactic acid 2.1, procalcitonin 13.5,white count 4.4 with a left shift. His SARScoronavirus 2 test is positive Chest x-ray reviewed by me shows bibasilar opacities consistent with viral pneumonia Twelve-lead EKG reviewed by me shows sinus tachycardia  9/8: Patient seen and examined.  Mentating clearly.  Normal work of breathing.  Remains on 55 L high flow nasal cannula at 65% FiO2  9/9: Patient seen and examined.  Continues to mentate clearly.  Little bit agitated this morning about continued hospital stay.  Remains on 55 L high flow nasal cannula at 65% FiO2.  9/10: Patient seen and examined.  Improving slightly from respiratory standpoint.  Titrated to 50 L high flow 55%.  9/11: Patient seen and examined.  Very angry this morning.  States that his breathing is better and he wants to leave.  I had  a lengthy discussion with him about his level of hypoxia.  He is currently on 40 L heated high flow nasal cannula at 40% FiO2.  Stable at this level of oxygen requirement if we are to remove it he will likely die.  I strongly encouraged him to stay in the hospital and not leave.  RN spoke to him as well and reiterated the message.  9/12: Patient seen and examined.  Much more calm this morning.  Continues to make daily improvements.  Titrated down to 30 L heated high flow nasal cannula.  9/13: Patient seen and examined.  Again asks when he can leave the hospital.  Continues to be dependent on heated high flow nasal cannula.  40 L at 40% heated high flow  9/14: Patient seen and examined.  Less agitated today.  Continues to be hypoxic.  Tapered to 35 L.   Assessment & Plan:   Principal Problem:   Acute hypoxemic respiratory failure due to COVID-19 [facility]) Active Problems:   Pneumonia due to COVID-19 virus   Thrombocytopenia (HCC)   Acute respiratory failure due to COVID-19 [facility])   AKI (acute kidney injury) (HCC)  Acute respiratory failure secondary to COVID-19 Multifocal pneumonia secondary to COVID-19 Patient has been dependent on heated high flow nasal cannula for several days We have been unable to wean He continues to mentate clearly with normal work of breathing Inflammatory markers overall downtrending Completed empiric antibiotic course Completed remdesivir course 9/9: Patient remains markedly hypoxic requiring heated high flow nasal cannula despite continued intervention 9/11: Patient continues to make daily progress however is getting  increasingly frustrated about continued hospital stay.  He keeps saying he wants to leave now and his breathing is better.  Despite my explanations he continues to express frustration. 9/12: Much more calm this morning.  Not trying to leave 9/14: slowly making progress Plan: Continue baricitinib Continue twice daily Solu-Medrol, dose  decreased Stress proning Stress incentive spirometry and flutter use Supplemental vitamins Oxygen, wean as tolerated Lasix 40 mg IV x 1.  Re-evaluate daily Check CTA thorax, rule out PE.  Daily labs discontinued.    Thrombocytopenia Unclear etiology, suspect reactive secondary to above Now resolved  Acute kidney injury Stable Creatinine at or near baseline Repeat IV Lasix  DVT prophylaxis: Lovenox Code Status: Full Family Communication: Spouse Marylene Land via phone [phone] on 9/14 disposition Plan: Status is: Inpatient  Remains inpatient appropriate because:Inpatient level of care appropriate due to severity of illness   Dispo: The patient is from: Home              Anticipated d/c is to: Home              Anticipated d/c date is: > 3 days              Patient currently is not medically stable to d/c.   Patient remains hypoxic.  Dependent on heated high flow nasal cannula.  Consultants:   None  Procedures:   None  Antimicrobials:  None   Subjective: Seen and examined.  Complaining of shortness of breath  Objective: Vitals:   09/09/20 0800 09/09/20 1000 09/09/20 1100 09/09/20 1200  BP:    (!) 111/92  Pulse: 77 90  82  Resp: (!) 22 (!) 26  (!) 25  Temp:    97.9 F (36.6 C)  TempSrc:    Oral  SpO2: 91% 97% 95% 93%  Weight:      Height:        Intake/Output Summary (Last 24 hours) at 09/09/2020 1416 Last data filed at 09/09/2020 1247 Gross per 24 hour  Intake 360 ml  Output 3725 ml  Net -3365 ml   Filed Weights   09/06/20 0507 09/07/20 0300 09/09/20 0554  Weight: 76.2 kg 79.3 kg 79.2 kg    Examination:  General: No apparent distress, patient appears well HEENT: Normocephalic, atraumatic Neck, supple, trachea midline, no tenderness Heart: Regular rate and rhythm, S1/S2 normal, no murmurs Lungs: Scattered crackles bilaterally.  Normal work of breathing.  30 L Abdomen: Soft, nontender, nondistended, positive bowel sounds Extremities: Normal,  atraumatic, no clubbing or cyanosis, normal muscle tone Skin: No rashes or lesions, normal color Neurologic: Cranial nerves grossly intact, sensation intact, alert and oriented x3 Psychiatric: Normal affect    Data Reviewed: I have personally reviewed following labs and imaging studies  CBC: Recent Labs  Lab 09/03/20 0446  WBC 5.1  NEUTROABS 4.5  HGB 13.9  HCT 40.4  MCV 91.8  PLT 268   Basic Metabolic Panel: Recent Labs  Lab 09/03/20 0446  NA 135  K 5.1  CL 99  CO2 25  GLUCOSE 245*  BUN 34*  CREATININE 1.09  CALCIUM 9.3   GFR: Estimated Creatinine Clearance: 65.7 mL/min (by C-G formula based on SCr of 1.09 mg/dL). Liver Function Tests: No results for input(s): AST, ALT, ALKPHOS, BILITOT, PROT, ALBUMIN in the last 168 hours. No results for input(s): LIPASE, AMYLASE in the last 168 hours. No results for input(s): AMMONIA in the last 168 hours. Coagulation Profile: No results for input(s): INR, PROTIME in the last 168 hours.  Cardiac Enzymes: No results for input(s): CKTOTAL, CKMB, CKMBINDEX, TROPONINI in the last 168 hours. BNP (last 3 results) No results for input(s): PROBNP in the last 8760 hours. HbA1C: No results for input(s): HGBA1C in the last 72 hours. CBG: No results for input(s): GLUCAP in the last 168 hours. Lipid Profile: No results for input(s): CHOL, HDL, LDLCALC, TRIG, CHOLHDL, LDLDIRECT in the last 72 hours. Thyroid Function Tests: No results for input(s): TSH, T4TOTAL, FREET4, T3FREE, THYROIDAB in the last 72 hours. Anemia Panel: No results for input(s): VITAMINB12, FOLATE, FERRITIN, TIBC, IRON, RETICCTPCT in the last 72 hours. Sepsis Labs: No results for input(s): PROCALCITON, LATICACIDVEN in the last 168 hours.  No results found for this or any previous visit (from the past 240 hour(s)).       Radiology Studies: No results found.      Scheduled Meds: . vitamin C  500 mg Oral Daily  . baricitinib  4 mg Oral Daily  . enoxaparin  (LOVENOX) injection  40 mg Subcutaneous Q24H  . fluticasone  2 spray Each Nare Daily  . Ipratropium-Albuterol  2 puff Inhalation Q6H  . loratadine  10 mg Oral Daily  . methylPREDNISolone (SOLU-MEDROL) injection  40 mg Intravenous Q12H  . pantoprazole  40 mg Oral Q0600  . sodium chloride flush  3 mL Intravenous Q12H  . zinc sulfate  220 mg Oral Daily   Continuous Infusions: . sodium chloride 250 mL (09/02/20 1024)     LOS: 13 days    Time spent:25 minutes    Tresa Moore, MD [facility] Pager [phone]  If 7PM-7AM, please contact night-coverage 09/09/2020, 2:16 PM

## 2020-09-10 LAB — GLUCOSE, CAPILLARY: Glucose-Capillary: 288 mg/dL — ABNORMAL HIGH (ref 70–99)

## 2020-09-10 MED ORDER — METHYLPREDNISOLONE SODIUM SUCC 40 MG IJ SOLR
20.0000 mg | Freq: Two times a day (BID) | INTRAMUSCULAR | Status: DC
  Administered 2020-09-10 – 2020-09-13 (×6): 20 mg via INTRAVENOUS
  Filled 2020-09-10 (×6): qty 1

## 2020-09-10 MED ORDER — ENSURE ENLIVE PO LIQD
237.0000 mL | Freq: Three times a day (TID) | ORAL | Status: DC
  Administered 2020-09-11 – 2020-09-13 (×5): 237 mL via ORAL

## 2020-09-10 NOTE — Progress Notes (Signed)
Encouraged pt to PRONE and use incentive spirometer throughout day    

## 2020-09-10 NOTE — Progress Notes (Signed)
Initial Nutrition Assessment  DOCUMENTATION CODES:   Not applicable  INTERVENTION:   Ensure Enlive po TID, each supplement provides 350 kcal and 20 grams of protein  Liberalize diet   NUTRITION DIAGNOSIS:   Increased nutrient needs related to catabolic illness (COVID 19) as evidenced by increased estimated needs.  GOAL:   Patient will meet greater than or equal to 90% of their needs  MONITOR:   PO intake, Supplement acceptance, Labs, Weight trends, Skin, I & O's  REASON FOR ASSESSMENT:   LOS    ASSESSMENT:   64 y/o male admitted with COVID 19, thrombocytopenia and AKI   Pt with good appetite and oral intake; pt eating 100% of meals in hospital. RD will add supplements to help pt meet his estimated needs. RD will also liberalize pt's diet a heart healthy diet is restrictive of protein. There is no weight history in chart to determine if any significant recent weight changes. Per chart, pt appears to be down 6lbs(3%) since admit.   Medications reviewed and include: Vitamin C, lovenox, solu-medrol, protonix, zinc  Labs reviewed: BUN 34(H), P 4.3 wnl, Mg 2.5(H)  NUTRITION - FOCUSED PHYSICAL EXAM: Unable to perform at this time as pt with COVID 19  Diet Order:   Diet Order            Diet regular Room service appropriate? Yes; Fluid consistency: Thin  Diet effective now                EDUCATION NEEDS:   No education needs have been identified at this time  Skin:  Skin Assessment: Reviewed RN Assessment  Last BM:  9/13  Height:   Ht Readings from Last 1 Encounters:  08/29/20 5\' 7"  (1.702 m)    Weight:   Wt Readings from Last 1 Encounters:  09/10/20 78.5 kg    Ideal Body Weight:  61.36 kg  BMI:  Body mass index is 27.11 kg/m.  Estimated Nutritional Needs:   Kcal:  1900-2200kcal/day  Protein:  95-110g/day  Fluid:  >2L/day  09/12/20 MS, RD, LDN Please refer to [facility] for RD and/or RD on-call/weekend/after hours pager

## 2020-09-10 NOTE — Progress Notes (Signed)
PROGRESS NOTE    Randy Ray  WUJ:[id number] DOB: [birth year] DOA: 08/27/2020 PCP: Patient, No Pcp Per   Brief Narrative:  HPI per Dr. Olive Bass E Crispis a 64 y.o.malewith medical historynosignificantpast medical historywhowas brought into theER by EMS for evaluation of fever,cough and shortness of breath. Patient stated that for over a week he has been experiencing intermittent fever, body aches and a dry cough. He also complained of poor appetite. Over the last 2 days he developed shortness of breath which has progressively worsened. He denies having any chest pains, no palpitations, no diaphoresis, no nausea, no vomiting or any changes in his bowel habits. When EMS arrived patient was found to have room air pulse oximetry of 76% and was placed on a nonrebreather mask with O2 at 10 L improving his pulse oximetry to 93%. He also received bronchodilator therapy. Patient is unvaccinated and denied having any Covid exposure. Upon arrival to the ER he received Solu-Medrol125mg  IV x 1 dose since he was wheezing. Presents with chief complaint of breathing, creatinine 2.57, BNP 37.8 lactic acid 2.1, procalcitonin 13.5,white count 4.4 with a left shift. His SARScoronavirus 2 test is positive Chest x-ray reviewed by me shows bibasilar opacities consistent with viral pneumonia Twelve-lead EKG reviewed by me shows sinus tachycardia  9/8: Patient seen and examined.  Mentating clearly.  Normal work of breathing.  Remains on 55 L high flow nasal cannula at 65% FiO2  9/9: Patient seen and examined.  Continues to mentate clearly.  Little bit agitated this morning about continued hospital stay.  Remains on 55 L high flow nasal cannula at 65% FiO2.  9/10: Patient seen and examined.  Improving slightly from respiratory standpoint.  Titrated to 50 L high flow 55%.  9/11: Patient seen and examined.  Very angry this morning.  States that his breathing is better and he wants to leave.  I had  a lengthy discussion with him about his level of hypoxia.  He is currently on 40 L heated high flow nasal cannula at 40% FiO2.  Stable at this level of oxygen requirement if we are to remove it he will likely die.  I strongly encouraged him to stay in the hospital and not leave.  RN spoke to him as well and reiterated the message.  9/12: Patient seen and examined.  Much more calm this morning.  Continues to make daily improvements.  Titrated down to 30 L heated high flow nasal cannula.  9/13: Patient seen and examined.  Again asks when he can leave the hospital.  Continues to be dependent on heated high flow nasal cannula.  40 L at 40% heated high flow  9/14: Patient seen and examined.  Less agitated today.  Continues to be hypoxic.  Tapered to 35 L.  9/15: Patient seen and examined.  Oxygen saturation remarkably improved.  Tapered to 6 L nasal cannula.  Feeling well.  Very adherent to incentive spirometry and proning.   Assessment & Plan:   Principal Problem:   Acute hypoxemic respiratory failure due to COVID-19 [facility]) Active Problems:   Pneumonia due to COVID-19 virus   Thrombocytopenia (HCC)   Acute respiratory failure due to COVID-19 (HCC)   AKI (acute kidney injury) (HCC)  Acute respiratory failure secondary to COVID-19 Multifocal pneumonia secondary to COVID-19 Patient has been dependent on heated high flow nasal cannula for several days We have been unable to wean He continues to mentate clearly with normal work of breathing Inflammatory markers overall downtrending Completed empiric  antibiotic course Completed remdesivir course 9/9: Patient remains markedly hypoxic requiring heated high flow nasal cannula despite continued intervention 9/11: Patient continues to make daily progress however is getting increasingly frustrated about continued hospital stay.  He keeps saying he wants to leave now and his breathing is better.  Despite my explanations he continues to express  frustration. 9/12: Much more calm this morning.  Not trying to leave 9/14: slowly making progress 9/15: Remarkable turnaround in oxygen saturation.  Tapered to 6 L nasal cannula CTA negative for PE Plan: Continue baricitinib, last dose 09/11/2020 Taper Solu-Medrol to 20 mg every 12 hours.  Recommend slow taper as patient has been on high-dose steroids for 2 weeks now Stress proning Stress incentive spirometry and flutter use Supplemental vitamins Oxygen, wean as tolerated No Lasix today Daily labs discontinued per patient request.  Check labs as needed for any changes in oxygen status  Thrombocytopenia Unclear etiology, suspect reactive secondary to above Now resolved  Acute kidney injury Stable Creatinine at or near baseline Repeat IV Lasix  DVT prophylaxis: Lovenox Code Status: Full Family Communication: Spouse Angela via phone [phone] on 9/14 disposition Plan: Status is: Inpatient  Remains inpatient appropriate because:Inpatient level of care appropriate due to severity of illness   Dispo: The patient is from: Home              Anticipated d/c is to: Home              Anticipated d/c date is: 2 days              Patient currently is not medically stable to d/c.   Patient has made a remarkable turnaround in the last 24 hours.  He has now been tapered to 6 L nasal cannula.  Will request therapy evaluations.  Plan for discharge within the next 2 days.  Consultants:   None  Procedures:   None  Antimicrobials:  None   Subjective: Seen and examined.  Mood improved this morning.  Oxygenation also much improved.  Objective: Vitals:   09/10/20 1200 09/10/20 1205 09/10/20 1210 09/10/20 1232  BP: 131/85  125/82   Pulse: 87 85 83   Resp: (!) 28 19 (!) 21   Temp:   (!) 97.4 F (36.3 C)   TempSrc:   Axillary   SpO2: 95% 96% 96% 95%  Weight:      Height:        Intake/Output Summary (Last 24 hours) at 09/10/2020 1310 Last data filed at 09/10/2020 1210 Gross  per 24 hour  Intake 120 ml  Output 1100 ml  Net -980 ml   Filed Weights   09/07/20 0300 09/09/20 0554 09/10/20 0400  Weight: 79.3 kg 79.2 kg 78.5 kg    Examination:  General: No apparent distress, patient appears well HEENT: Normocephalic, atraumatic Neck, supple, trachea midline, no tenderness Heart: Regular rate and rhythm, S1/S2 normal, no murmurs Lungs: Improved air movement.  Bibasilar crackles.  Normal work of breathing.  6 L Abdomen: Soft, nontender, nondistended, positive bowel sounds Extremities: Normal, atraumatic, no clubbing or cyanosis, normal muscle tone Skin: No rashes or lesions, normal color Neurologic: Cranial nerves grossly intact, sensation intact, alert and oriented x3 Psychiatric: Normal affect    Data Reviewed: I have personally reviewed following labs and imaging studies  CBC: No results for input(s): WBC, NEUTROABS, HGB, HCT, MCV, PLT in the last 168 hours. Basic Metabolic Panel: No results for input(s): NA, K, CL, CO2, GLUCOSE, BUN, CREATININE, CALCIUM, MG, PHOS in  the last 168 hours. GFR: Estimated Creatinine Clearance: 65.7 mL/min (by C-G formula based on SCr of 1.09 mg/dL). Liver Function Tests: No results for input(s): AST, ALT, ALKPHOS, BILITOT, PROT, ALBUMIN in the last 168 hours. No results for input(s): LIPASE, AMYLASE in the last 168 hours. No results for input(s): AMMONIA in the last 168 hours. Coagulation Profile: No results for input(s): INR, PROTIME in the last 168 hours. Cardiac Enzymes: No results for input(s): CKTOTAL, CKMB, CKMBINDEX, TROPONINI in the last 168 hours. BNP (last 3 results) No results for input(s): PROBNP in the last 8760 hours. HbA1C: No results for input(s): HGBA1C in the last 72 hours. CBG: No results for input(s): GLUCAP in the last 168 hours. Lipid Profile: No results for input(s): CHOL, HDL, LDLCALC, TRIG, CHOLHDL, LDLDIRECT in the last 72 hours. Thyroid Function Tests: No results for input(s): TSH,  T4TOTAL, FREET4, T3FREE, THYROIDAB in the last 72 hours. Anemia Panel: No results for input(s): VITAMINB12, FOLATE, FERRITIN, TIBC, IRON, RETICCTPCT in the last 72 hours. Sepsis Labs: No results for input(s): PROCALCITON, LATICACIDVEN in the last 168 hours.  No results found for this or any previous visit (from the past 240 hour(s)).       Radiology Studies: CT ANGIO CHEST PE W OR WO CONTRAST  Result Date: 09/09/2020 CLINICAL DATA:  Shortness of breath and fever.  Cough. EXAM: CT ANGIOGRAPHY CHEST WITH CONTRAST TECHNIQUE: Multidetector CT imaging of the chest was performed using the standard protocol during bolus administration of intravenous contrast. Multiplanar CT image reconstructions and MIPs were obtained to evaluate the vascular anatomy. CONTRAST:  OMNIPAQUE IOHEXOL 350 MG/ML SOLN COMPARISON:  Chest radiograph August 27, 2020 FINDINGS: Cardiovascular: There is no demonstrable pulmonary embolus. Ascending thoracic aortic diameter measures 3.2 x 3.2 cm. The measured diameter of the distal aortic arch is 3.5 cm. No aortic dissection is appreciable. There are scattered foci of calcification in visualized great vessels. There are foci of aortic atherosclerosis. There are occasional foci of coronary artery calcification. There is no pericardial effusion or pericardial thickening. Mediastinum/Nodes: Visualized thyroid appears normal. There are occasional subcentimeter mediastinal lymph nodes. There is no adenopathy by size criteria evident. No appreciable esophageal lesions. Lungs/Pleura: There is underlying centrilobular emphysematous change. There is airspace opacity scattered throughout the lungs bilaterally without appreciable consolidation. No pleural effusions are evident. Upper Abdomen: Visualized upper abdominal structures appear unremarkable. Musculoskeletal: There are no blastic or lytic bone lesions. No evident chest wall lesions. Review of the MIP images confirms the above  findings. IMPRESSION: 1.  No evident pulmonary embolus. 2. 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 Recommend annual imaging followup by CTA or MRA. This recommendation follows 2010 ACCF/AHA/AATS/ACR/ASA/SCA/SCAI/SIR/STS/SVM Guidelines for the Diagnosis and Management of Patients with Thoracic Aortic Disease. Circulation.2010; 121: M578-I696. Aortic aneurysm NOS (ICD10-I71.9). No dilatation of the ascending thoracic aorta. No evident dissection. There are foci of aortic atherosclerosis as well as foci of great vessel and coronary artery calcification. 3. Underlying centrilobular emphysematous change. Airspace opacity seen throughout the lungs fairly diffusely without consolidation. Suspect atypical organism pneumonia superimposed on emphysema. No pleural effusions. 4.  No evident adenopathy. Aortic Atherosclerosis (ICD10-I70.0) and Emphysema (ICD10-J43.9). Electronically Signed   By: Bretta Bang III M.D.   On: 09/09/2020 15:43        Scheduled Meds: . vitamin C  500 mg Oral Daily  . baricitinib  4 mg Oral Daily  . enoxaparin (LOVENOX) injection  40 mg Subcutaneous Q24H  . feeding supplement (ENSURE  ENLIVE)  237 mL Oral TID BM  . fluticasone  2 spray Each Nare Daily  . Ipratropium-Albuterol  2 puff Inhalation Q6H  . loratadine  10 mg Oral Daily  . methylPREDNISolone (SOLU-MEDROL) injection  40 mg Intravenous Q12H  . pantoprazole  40 mg Oral Q0600  . sodium chloride flush  3 mL Intravenous Q12H  . zinc sulfate  220 mg Oral Daily   Continuous Infusions: . sodium chloride 250 mL (09/02/20 1024)     LOS: 14 days    Time spent:25 minutes    Tresa Moore, MD [facility] Pager [phone]  If 7PM-7AM, please contact night-coverage 09/10/2020, 1:10 PM

## 2020-09-10 NOTE — Plan of Care (Signed)
  Problem: Clinical Measurements: Goal: Ability to maintain clinical measurements within normal limits will improve Outcome: Progressing Goal: Respiratory complications will improve Outcome: Progressing   Problem: Safety: Goal: Ability to remain free from injury will improve Outcome: Progressing   

## 2020-09-11 DIAGNOSIS — J449 Chronic obstructive pulmonary disease, unspecified: Secondary | ICD-10-CM

## 2020-09-11 DIAGNOSIS — I7122 Aneurysm of the aortic arch, without rupture: Secondary | ICD-10-CM

## 2020-09-11 DIAGNOSIS — I712 Thoracic aortic aneurysm, without rupture: Secondary | ICD-10-CM

## 2020-09-11 NOTE — Progress Notes (Addendum)
SATURATION QUALIFICATIONS: (This note is used to comply with regulatory documentation for home oxygen)  Patient Saturations on Room Air at Rest = 77%  Patient Saturations on Room Air while Ambulating = na%  Patient Saturations on 4 Liters of oxygen while Ambulating = 81%  Please briefly explain why patient needs home oxygen: patient up in room. Walked on 4L VIA [place] from door to bed x2. Stat dropped to 81%. Once returned to bed, patient recovered to 91% on 4l after a few minutes. Will continue to monitor closely

## 2020-09-11 NOTE — Progress Notes (Signed)
Patient ID: MURRIEL EIDEM, male   DOB: 05-25-1956, 64 y.o.   MRN: [id number] [facility] PROGRESS NOTE  ADONUS USELMAN IFO:[id number] DOB: 1956/10/10 DOA: 08/27/2020 PCP: Patient, No Pcp Per  HPI/Subjective: Patient told me he feels great and wanted to go home.  No complaints of shortness of breath.  A little cough.  Admitted with COVID-19 pneumonia.  Was still on 4.5 L of oxygen this morning when I saw him.  Objective: Vitals:   09/11/20 1100 09/11/20 1230  BP:  130/84  Pulse: 86 96  Resp: (!) 29 (!) 29  Temp:  97.9 F (36.6 C)  SpO2: 90% 90%    Intake/Output Summary (Last 24 hours) at 09/11/2020 1426 Last data filed at 09/11/2020 1416 Gross per 24 hour  Intake 780 ml  Output 2350 ml  Net -1570 ml   Filed Weights   09/07/20 0300 09/09/20 0554 09/10/20 0400  Weight: 79.3 kg 79.2 kg 78.5 kg    ROS: Review of Systems  Constitutional: Negative for malaise/fatigue.  Respiratory: Negative for cough and shortness of breath.   Cardiovascular: Negative for chest pain.  Gastrointestinal: Negative for abdominal pain, nausea and vomiting.   Exam: Physical Exam HENT:     Nose: No mucosal edema.     Mouth/Throat:     Pharynx: No oropharyngeal exudate.  Eyes:     General: Lids are normal.     Conjunctiva/sclera: Conjunctivae normal.     Pupils: Pupils are equal, round, and reactive to light.  Cardiovascular:     Rate and Rhythm: Normal rate and regular rhythm.     Heart sounds: Normal heart sounds, S1 normal and S2 normal.  Pulmonary:     Breath sounds: Examination of the right-lower field reveals decreased breath sounds and rhonchi. Examination of the left-lower field reveals decreased breath sounds and rhonchi. Decreased breath sounds and rhonchi present. No wheezing or rales.  Abdominal:     General: Abdomen is flat.     Palpations: Abdomen is soft.     Tenderness: There is no abdominal tenderness.  Musculoskeletal:     Right ankle: No swelling.     Left ankle: No  swelling.  Skin:    General: Skin is warm.     Findings: No rash.  Neurological:     Mental Status: He is alert and oriented to person, place, and time.       Data Reviewed: BNP (last 3 results) Recent Labs    08/27/20 1108  BNP 37.8   CBG: Recent Labs  Lab 09/10/20 1936  GLUCAP 288*     Studies: CT ANGIO CHEST PE W OR WO CONTRAST  Result Date: 09/09/2020 CLINICAL DATA:  Shortness of breath and fever.  Cough. EXAM: CT ANGIOGRAPHY CHEST WITH CONTRAST TECHNIQUE: Multidetector CT imaging of the chest was performed using the standard protocol during bolus administration of intravenous contrast. Multiplanar CT image reconstructions and MIPs were obtained to evaluate the vascular anatomy. CONTRAST:  OMNIPAQUE IOHEXOL 350 MG/ML SOLN COMPARISON:  Chest radiograph August 27, 2020 FINDINGS: Cardiovascular: There is no demonstrable pulmonary embolus. Ascending thoracic aortic diameter measures 3.2 x 3.2 cm. The measured diameter of the distal aortic arch is 3.5 cm. No aortic dissection is appreciable. There are scattered foci of calcification in visualized great vessels. There are foci of aortic atherosclerosis. There are occasional foci of coronary artery calcification. There is no pericardial effusion or pericardial thickening. Mediastinum/Nodes: Visualized thyroid appears normal. There are occasional subcentimeter mediastinal lymph nodes. There is  no adenopathy by size criteria evident. No appreciable esophageal lesions. Lungs/Pleura: There is underlying centrilobular emphysematous change. There is airspace opacity scattered throughout the lungs bilaterally without appreciable consolidation. No pleural effusions are evident. Upper Abdomen: Visualized upper abdominal structures appear unremarkable. Musculoskeletal: There are no blastic or lytic bone lesions. No evident chest wall lesions. Review of the MIP images confirms the above findings. IMPRESSION: 1.  No evident pulmonary embolus.  2. 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 Recommend annual imaging followup by CTA or MRA. This recommendation follows 2010 ACCF/AHA/AATS/ACR/ASA/SCA/SCAI/SIR/STS/SVM Guidelines for the Diagnosis and Management of Patients with Thoracic Aortic Disease. Circulation.2010; 121: B762-G315. Aortic aneurysm NOS (ICD10-I71.9). No dilatation of the ascending thoracic aorta. No evident dissection. There are foci of aortic atherosclerosis as well as foci of great vessel and coronary artery calcification. 3. Underlying centrilobular emphysematous change. Airspace opacity seen throughout the lungs fairly diffusely without consolidation. Suspect atypical organism pneumonia superimposed on emphysema. No pleural effusions. 4.  No evident adenopathy. Aortic Atherosclerosis (ICD10-I70.0) and Emphysema (ICD10-J43.9). Electronically Signed   By: Bretta Bang III M.D.   On: 09/09/2020 15:43    Scheduled Meds: . vitamin C  500 mg Oral Daily  . baricitinib  4 mg Oral Daily  . enoxaparin (LOVENOX) injection  40 mg Subcutaneous Q24H  . feeding supplement (ENSURE ENLIVE)  237 mL Oral TID BM  . fluticasone  2 spray Each Nare Daily  . Ipratropium-Albuterol  2 puff Inhalation Q6H  . loratadine  10 mg Oral Daily  . methylPREDNISolone (SOLU-MEDROL) injection  20 mg Intravenous Q12H  . pantoprazole  40 mg Oral Q0600  . sodium chloride flush  3 mL Intravenous Q12H  . zinc sulfate  220 mg Oral Daily   Continuous Infusions: . sodium chloride 250 mL (09/02/20 1024)    Assessment/Plan:  1. Acute hypoxic respiratory failure.  High likelihood of this patient going home with oxygen.  Patient stated he felt better I did walk him around and he desaturated while on oxygen.  The nurse again walked him around today and desaturated into the 80s with ambulation. 2. Multifocal pneumonia secondary to COVID-19.  On Solu-Medrol.  Finished remdesivir.  Finishing up baricitinib.  Vitamin C and  zinc. 3. Underlying COPD on CT scan 4. 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  Recommend annual imaging for aneurysm. 5. Thrombocytopenia improved into normal range     Code Status:     Code Status Orders  (From admission, onward)         Start     Ordered   08/27/20 1344  Full code  Continuous        08/27/20 1346        Code Status History    This patient has a current code status but no historical code status.   Advance Care Planning Activity     Family Communication: Spoke with Marylene Land on the phone Disposition Plan: Status is: Inpatient  Dispo: The patient is from: Home              Anticipated d/c is to: Home              Anticipated d/c date is: We will take things on a day-to-day basis on when to go home.  Patient dropped his oxygen saturations into the low 80s with ambulation on 4 L.  Would like to see him hold his saturations with ambulation prior to disposition  Patient currently being treated for acute hypoxic respiratory failure secondary to COVID-19 pneumonia  Time spent: 28 minutes  Cerita Rabelo [employer]

## 2020-09-11 NOTE — Evaluation (Signed)
Physical Therapy Evaluation Patient Details Name: Randy Ray MRN: [id number] DOB: 1956-04-12 Today's Date: 09/11/2020   History of Present Illness  64 y/o male who came in with multiple days of fever, fatigue, loss of appetite.  Found to be very hypoxic on arrival, + for Covid 19.  Clinical Impression  Pt did very well from a PT perspective (good strength, mobility, balance, etc) but struggled to do any prolonged activity.  He was on 4L O2 t/o the session, on arrival his HR was in the 90s and O2 in the high 90s but these numbers slowly but consistently diverged with ambulation/effort and with 2 bouts of 60-70 ft of ambulation each time his O2 dropped to the low 80s and HR up to nearly 140.  Pt with no LOBs, did feel some fatigue, but not excessive.  Pt will likely not need PT f/u at discharge, but we will keep him on caseload while admitted to help facilitate safe activity as able as he is currently far from his baseline.    Follow Up Recommendations No PT follow up    Equipment Recommendations   (will likely need O2)    Recommendations for Other Services       Precautions / Restrictions Precautions Precautions: None Restrictions Weight Bearing Restrictions: No      Mobility  Bed Mobility Overal bed mobility: Modified Independent             General bed mobility comments: Pt able to get up to sitting with relative ease  Transfers Overall transfer level: Modified independent Equipment used: None             General transfer comment: Assist simply to manage lines (O2, tele, etc) but pt able to stand w/o issue, no unsteadiness  Ambulation/Gait Ambulation/Gait assistance: Modified independent (Device/Increase time) Gait Distance (Feet): 70 Feet Assistive device: None       General Gait Details: 2 bouts of ambulation on 4L O2.  No AD, no LOBs, no overt safety issues apart from O2 desaturations.  He was able to maintain decently deep/focused breathing but even with  slow, cautious gait had consistent drop in O2 down to the low 80s and HR into the 130s.  After prolonged seated rest break (~5 minutes) numbers improved and we did another similar walk with similar change in vitals  Stairs            Wheelchair Mobility    Modified Rankin (Stroke Patients Only)       Balance Overall balance assessment: Independent                                           Pertinent Vitals/Pain Pain Assessment: No/denies pain    Home Living Family/patient expects to be discharged to:: Private residence Living Arrangements: Spouse/significant other     Home Access: Level entry     Home Layout: One level Home Equipment: None      Prior Function Level of Independence: Independent         Comments: Pt able to be out in the community, stays active, runs errands, etc     Hand Dominance        Extremity/Trunk Assessment   Upper Extremity Assessment Upper Extremity Assessment: Overall WFL for tasks assessed    Lower Extremity Assessment Lower Extremity Assessment: Overall WFL for tasks assessed       Communication  Communication: No difficulties  Cognition Arousal/Alertness: Awake/alert Behavior During Therapy: WFL for tasks assessed/performed Overall Cognitive Status: Within Functional Limits for tasks assessed                                        General Comments General comments (skin integrity, edema, etc.): Pt did very well with PT aspect of exam, struggled with keeping O2 and HR appropriate with modest phyiscal exertion    Exercises     Assessment/Plan    PT Assessment Patient needs continued PT services  PT Problem List Decreased activity tolerance;Decreased safety awareness;Cardiopulmonary status limiting activity       PT Treatment Interventions Functional mobility training;Therapeutic activities;Therapeutic exercise;Gait training;Patient/family education    PT Goals (Current goals  can be found in the Care Plan section)  Acute Rehab PT Goals Patient Stated Goal: go home PT Goal Formulation: With patient Time For Goal Achievement: 09/25/20 Potential to Achieve Goals: Good    Frequency Min 2X/week   Barriers to discharge        Co-evaluation               AM-PAC PT "6 Clicks" Mobility  Outcome Measure Help needed turning from your back to your side while in a flat bed without using bedrails?: None Help needed moving from lying on your back to sitting on the side of a flat bed without using bedrails?: None Help needed moving to and from a bed to a chair (including a wheelchair)?: None Help needed standing up from a chair using your arms (e.g., wheelchair or bedside chair)?: None Help needed to walk in hospital room?: None Help needed climbing 3-5 steps with a railing? : None 6 Click Score: 24    End of Session Equipment Utilized During Treatment: Oxygen (4L) Activity Tolerance: Patient limited by fatigue Patient left: in chair;with call bell/phone within reach Nurse Communication: Mobility status (O2/HR with activity) PT Visit Diagnosis: Difficulty in walking, not elsewhere classified (R26.2)    Time: 2482-5003 PT Time Calculation (min) (ACUTE ONLY): 35 min   Charges:   PT Evaluation $PT Eval Low Complexity: 1 Low PT Treatments $Gait Training: 8-22 mins        Malachi Pro, DPT 09/11/2020, 4:09 PM

## 2020-09-12 LAB — CBC
HCT: 38.8 % — ABNORMAL LOW (ref 39.0–52.0)
Hemoglobin: 13.7 g/dL (ref 13.0–17.0)
MCH: 31.4 pg (ref 26.0–34.0)
MCHC: 35.3 g/dL (ref 30.0–36.0)
MCV: 89 fL (ref 80.0–100.0)
Platelets: 286 10*3/uL (ref 150–400)
RBC: 4.36 MIL/uL (ref 4.22–5.81)
RDW: 12.6 % (ref 11.5–15.5)
WBC: 7.7 10*3/uL (ref 4.0–10.5)
nRBC: 0 % (ref 0.0–0.2)

## 2020-09-12 LAB — BASIC METABOLIC PANEL
Anion gap: 9 (ref 5–15)
BUN: 36 mg/dL — ABNORMAL HIGH (ref 8–23)
CO2: 27 mmol/L (ref 22–32)
Calcium: 9 mg/dL (ref 8.9–10.3)
Chloride: 96 mmol/L — ABNORMAL LOW (ref 98–111)
Creatinine, Ser: 1.14 mg/dL (ref 0.61–1.24)
GFR calc Af Amer: >60 mL/min (ref >60)
GFR calc non Af Amer: >60 mL/min (ref >60)
Glucose, Bld: 148 mg/dL — ABNORMAL HIGH (ref 70–99)
Potassium: 4.8 mmol/L (ref 3.5–5.1)
Sodium: 132 mmol/L — ABNORMAL LOW (ref 135–145)

## 2020-09-12 NOTE — Progress Notes (Signed)
Patient ID: Randy Ray, male   DOB: 1956-08-16, 64 y.o.   MRN: [id number] [facility] PROGRESS NOTE  Randy Ray CLE:[id number] DOB: December 27, 1956 DOA: 08/27/2020 PCP: Patient, No Pcp Per  HPI/Subjective: Patient feeling fine.  Offers no complaints.  Still when he ambulates his oxygen drops down into the low 80s.  Objective: Vitals:   09/12/20 0959 09/12/20 1053  BP:  122/71  Pulse:  91  Resp:  17  Temp:  97.9 F (36.6 C)  SpO2: 96% 97%    Intake/Output Summary (Last 24 hours) at 09/12/2020 1429 Last data filed at 09/12/2020 0600 Gross per 24 hour  Intake 730 ml  Output 1375 ml  Net -645 ml   Filed Weights   09/09/20 0554 09/10/20 0400 09/12/20 0301  Weight: 79.2 kg 78.5 kg 80.6 kg    ROS: Review of Systems  Respiratory: Negative for cough and shortness of breath.   Cardiovascular: Negative for chest pain.  Gastrointestinal: Negative for abdominal pain, diarrhea and vomiting.   Exam: Physical Exam HENT:     Head: Normocephalic.     Mouth/Throat:     Pharynx: No oropharyngeal exudate.  Eyes:     General: Lids are normal.     Conjunctiva/sclera: Conjunctivae normal.     Pupils: Pupils are equal, round, and reactive to light.  Cardiovascular:     Rate and Rhythm: Normal rate and regular rhythm.     Heart sounds: Normal heart sounds, S1 normal and S2 normal.  Pulmonary:     Breath sounds: Examination of the right-lower field reveals decreased breath sounds. Examination of the left-lower field reveals decreased breath sounds. Decreased breath sounds present. No wheezing, rhonchi or rales.  Abdominal:     Palpations: Abdomen is soft.     Tenderness: There is no abdominal tenderness.  Musculoskeletal:     Right lower leg: No swelling.     Left lower leg: No swelling.  Skin:    General: Skin is warm.     Findings: No rash.  Neurological:     Mental Status: He is alert and oriented to person, place, and time.       Data Reviewed: Basic Metabolic  Panel: Recent Labs  Lab 09/12/20 0603  NA 132*  K 4.8  CL 96*  CO2 27  GLUCOSE 148*  BUN 36*  CREATININE 1.14  CALCIUM 9.0   CBC: Recent Labs  Lab 09/12/20 0603  WBC 7.7  HGB 13.7  HCT 38.8*  MCV 89.0  PLT 286   BNP (last 3 results) Recent Labs    08/27/20 1108  BNP 37.8     CBG: Recent Labs  Lab 09/10/20 1936  GLUCAP 288*    Scheduled Meds: . vitamin C  500 mg Oral Daily  . enoxaparin (LOVENOX) injection  40 mg Subcutaneous Q24H  . feeding supplement (ENSURE ENLIVE)  237 mL Oral TID BM  . fluticasone  2 spray Each Nare Daily  . Ipratropium-Albuterol  2 puff Inhalation Q6H  . loratadine  10 mg Oral Daily  . methylPREDNISolone (SOLU-MEDROL) injection  20 mg Intravenous Q12H  . pantoprazole  40 mg Oral Q0600  . sodium chloride flush  3 mL Intravenous Q12H  . zinc sulfate  220 mg Oral Daily   Continuous Infusions: . sodium chloride 250 mL (09/02/20 1024)    Assessment/Plan:  1. Acute hypoxic respiratory failure.  The patient was tapered down to 3 L at rest.  With ambulation on 4 L he did drop his  saturations down into the low 80s again today.  Continue to exercise.  The patient will likely need home oxygen. 2. Multifocal pneumonia secondary to COVID-19.  Continue low-dose Solu-Medrol.  Finished remdesivir and baricitinib.  Continue vitamin C and zinc.  Continue ambulation. 3. Underlying COPD on CT scan 4. 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  Recommend continued annual imaging for aneurysm 5. Thrombocytopenia during the hospital course.  This has improved into the normal range at this time.     Code Status:     Code Status Orders  (From admission, onward)         Start     Ordered   08/27/20 1344  Full code  Continuous        08/27/20 1346        Code Status History    This patient has a current code status but no historical code status.   Advance Care Planning Activity     Family Communication: Spoke with Marylene Land on the  phone Disposition Plan: Status is: Inpatient  Dispo: The patient is from: Home              Anticipated d/c is to: Home              Anticipated d/c date is: Trying to get him on the least amount of oxygen as possible and trying to hold his saturations with ambulation prior to disposition.  With his pulse ox dropping into the low 80s today we will not discharged today.              Patient currently still dropping his saturations in the low 80s with ambulation.  Time spent: 27 minutes  Randy Ray [employer]

## 2020-09-12 NOTE — Progress Notes (Signed)
Received report from [facility].  Pt to transfer to 103 for continued medical treatment before likely am discharge per report.

## 2020-09-12 NOTE — Plan of Care (Signed)
Patient Fiance/Angela notified of room change and give # to contact.

## 2020-09-13 MED ORDER — ASCORBIC ACID 500 MG PO TABS
500.0000 mg | ORAL_TABLET | Freq: Every day | ORAL | 0 refills | Status: DC
Start: 2020-09-14 — End: 2023-03-27

## 2020-09-13 MED ORDER — ZINC SULFATE 220 (50 ZN) MG PO CAPS: 220.0000 mg | ORAL_CAPSULE | Freq: Every day | ORAL | 0 refills | Status: DC

## 2020-09-13 MED ORDER — ENSURE ENLIVE PO LIQD: 237.0000 mL | Freq: Three times a day (TID) | ORAL | 0 refills | Status: DC

## 2020-09-13 MED ORDER — ALBUTEROL SULFATE HFA 108 (90 BASE) MCG/ACT IN AERS: 2.0000 | INHALATION_SPRAY | Freq: Four times a day (QID) | RESPIRATORY_TRACT | 0 refills | Status: DC | PRN

## 2020-09-13 MED ORDER — PREDNISONE 10 MG PO TABS: ORAL_TABLET | ORAL | 0 refills | Status: DC

## 2020-09-13 MED ORDER — FLUTICASONE PROPIONATE 50 MCG/ACT NA SUSP: 2.0000 | Freq: Every day | NASAL | 0 refills | Status: DC

## 2020-09-13 MED ORDER — LORATADINE 10 MG PO TABS
10.0000 mg | ORAL_TABLET | Freq: Every day | ORAL | 0 refills | Status: DC
Start: 2020-09-13 — End: 2023-03-27

## 2020-09-13 NOTE — Progress Notes (Signed)
Awaiting oxygen delivery to pt's room prior to discharge

## 2020-09-13 NOTE — TOC Progression Note (Signed)
Transition of Care [facility]) - Progression Note    Patient Details  Name: Randy Ray MRN: [id number] Date of Birth: January 18, 1956  Transition of Care [facility]) CM/SW Contact  Alara Daniel, [place], [place] Phone Number:[phone] 09/13/2020, 11:44 AM  Clinical Narrative:    [facility] contacted, spoke with Velna Hatchet [phone]. Oxygen to be delivered today to patient's room.   [place], LCSW Transitions of Care [phone]         Expected Discharge Plan and Services           Expected Discharge Date: 09/13/20                                     Social Determinants of Health (SDOH) Interventions    Readmission Risk Interventions No flowsheet data found.

## 2020-09-13 NOTE — Discharge Summary (Signed)
[facility] [place] [facility]   PATIENT NAME: Randy Ray    MR#:  [id number]  DATE OF BIRTH:  [birth year]  DATE OF ADMISSION:  08/27/2020 ADMITTING PHYSICIAN: Rodolph Bong, MD  DATE OF DISCHARGE: 09/13/2020  3:28 PM  PRIMARY CARE PHYSICIAN: Patient, No Pcp Per    ADMISSION DIAGNOSIS:  Pneumonia [J18.9] Hypoxia [R09.02] AKI (acute kidney injury) (HCC) [N17.9] Acute hypoxemic respiratory failure due to COVID-19 (HCC) [U07.1, J96.01] Pneumonia due to COVID-19 virus [U07.1, J12.82] COVID-19 [U07.1]  DISCHARGE DIAGNOSIS:  Principal Problem:   Acute hypoxemic respiratory failure due to COVID-19 [facility]) Active Problems:   Pneumonia due to COVID-19 virus   Thrombocytopenia (HCC)   Acute respiratory failure due to COVID-19 (HCC)   AKI (acute kidney injury) (HCC)   Chronic obstructive pulmonary disease (HCC)   Aortic arch aneurysm (HCC)   SECONDARY DIAGNOSIS:  History reviewed. No pertinent past medical history.  HOSPITAL COURSE:   1.  Acute on chronic hypoxic respiratory failure.  The patient was on oxygen the entire hospital course.  The patient was on heated high flow nasal cannula 50 L, 65% FiO2 at his maximum oxygen requirements.  The last 2 days prior to disposition he did drop his saturations with ambulating into the 80s.  Unfortunately the patient was unable to be tapered off oxygen.  He will go home with 2 L of oxygen at rest and 3 with ambulation.  I personally walked the patient around the nursing station today and pulse ox on 3 L held above 90%.  Will refer to pulmonary as outpatient to evaluate whether he can come off oxygen in the future. 2.  Multifocal pneumonia secondary to COVID-19.  The patient finished remdesivir and baricitinib during the hospital course.  He was on Solu-Medrol during the entire hospital course and I will switch over to prednisone taper upon going home.  Continue vitamin C and zinc.  Continue albuterol inhaler. 3.   Underlying COPD on CT scan 4.  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  Recommend continue annual imaging for aneurysm. 5.  Thrombocytopenia during the hospital course.  This has improved into the normal range.  DISCHARGE CONDITIONS:   Satisfactory  CONSULTS OBTAINED:  None  DRUG ALLERGIES:   Allergies  Allergen Reactions  . Bee Pollen Shortness Of Breath    DISCHARGE MEDICATIONS:   Allergies as of 09/13/2020      Reactions   Bee Pollen Shortness Of Breath      Medication List    TAKE these medications   albuterol 108 (90 Base) MCG/ACT inhaler Commonly known as: VENTOLIN HFA Inhale 2 puffs into the lungs every 6 (six) hours as needed for wheezing or shortness of breath.   ascorbic acid 500 MG tablet Commonly known as: VITAMIN C Take 1 tablet (500 mg total) by mouth daily. Start taking on: September 14, 2020   feeding supplement (ENSURE ENLIVE) Liqd Take 237 mLs by mouth 3 (three) times daily between meals.   fluticasone 50 MCG/ACT nasal spray Commonly known as: FLONASE Place 2 sprays into both nostrils daily. Start taking on: September 14, 2020   loratadine 10 MG tablet Commonly known as: CLARITIN Take 1 tablet (10 mg total) by mouth daily.   predniSONE 10 MG tablet Commonly known as: DELTASONE 2 tabs po daily for three days the 1 tab po daily for three days then 1/2 tab po daily for four days   zinc sulfate 220 (50 Zn) MG capsule Take 1 capsule (  220 mg total) by mouth daily. Start taking on: September 14, 2020            Durable Medical Equipment  (From admission, onward)         Start     Ordered   09/13/20 1119  For home use only DME oxygen  Once       Comments: 3 liters with ambulation  Question Answer Comment  Length of Need 6 Months   Mode or (Route) Nasal cannula   Liters per Minute 2   Frequency Continuous (stationary and portable oxygen unit needed)   Oxygen delivery system Gas      09/13/20 1119           DISCHARGE  INSTRUCTIONS:   [facility] weeks Follow-up Dr. Jayme Cloud pulmonary 1 month  If you experience worsening of your admission symptoms, develop shortness of breath, life threatening emergency, suicidal or homicidal thoughts you must seek medical attention immediately by calling 911 or calling your MD immediately  if symptoms less severe.  You Must read complete instructions/literature along with all the possible adverse reactions/side effects for all the Medicines you take and that have been prescribed to you. Take any new Medicines after you have completely understood and accept all the possible adverse reactions/side effects.   Please note  You were cared for by a hospitalist during your hospital stay. If you have any questions about your discharge medications or the care you received while you were in the hospital after you are discharged, you can call the unit and asked to speak with the hospitalist on call if the hospitalist that took care of you is not available. Once you are discharged, your primary care physician will handle any further medical issues. Please note that NO REFILLS for any discharge medications will be authorized once you are discharged, as it is imperative that you return to your primary care physician (or establish a relationship with a primary care physician if you do not have one) for your aftercare needs so that they can reassess your need for medications and monitor your lab values.    Today   CHIEF COMPLAINT:   Chief Complaint  Patient presents with  . Shortness of Breath  . Fever    HISTORY OF PRESENT ILLNESS:  Eyoel Ray  is a 64 y.o. male with a known history of came in initially with shortness of breath and fever and found to have COVID-19 pneumonia and acute hypoxic respiratory failure   VITAL SIGNS:  Blood pressure 122/84, pulse (!) 110, temperature 98.1 F (36.7 C), temperature source Oral, resp. rate 20, height 5\' 7"  (1.702 m),  weight 80.6 kg, SpO2 98 %.  I/O:    Intake/Output Summary (Last 24 hours) at 09/13/2020 1745 Last data filed at 09/13/2020 0800 Gross per 24 hour  Intake 640 ml  Output 1150 ml  Net -510 ml    PHYSICAL EXAMINATION:  GENERAL:  64 y.o.-year-old patient lying in the bed with no acute distress.  EYES: Pupils equal, round, reactive to light and accommodation. No scleral icterus. Extraocular muscles intact.  HEENT: Head atraumatic, normocephalic. Oropharynx and nasopharynx clear.  LUNGS: Decreased breath sounds bilateral bases, no wheezing, rales,rhonchi or crepitation. No use of accessory muscles of respiration.  CARDIOVASCULAR: S1, S2 tachycardic. No murmurs, rubs, or gallops.  ABDOMEN: Soft, non-tender, non-distended. EXTREMITIES: No pedal edema.  NEUROLOGIC: Cranial nerves II through XII are intact. Muscle strength 5/5 in all extremities. Sensation intact. Gait slow  and steady.  PSYCHIATRIC: The patient is alert and oriented x 3.  SKIN: No obvious rash, lesion, or ulcer.   DATA REVIEW:   CBC Recent Labs  Lab 09/12/20 0603  WBC 7.7  HGB 13.7  HCT 38.8*  PLT 286    Chemistries  Recent Labs  Lab 09/12/20 0603  NA 132*  K 4.8  CL 96*  CO2 27  GLUCOSE 148*  BUN 36*  CREATININE 1.14  CALCIUM 9.0    Microbiology Results  Results for orders placed or performed during the hospital encounter of 08/27/20  Blood culture (routine single)     Status: None   Collection Time: 08/27/20 11:08 AM   Specimen: BLOOD  Result Value Ref Range Status   Specimen Description BLOOD RIGHT HAND  Final   Special Requests   Final    BOTTLES DRAWN AEROBIC AND ANAEROBIC Blood Culture adequate volume   Culture   Final    NO GROWTH 5 DAYS Performed at [facility], [address] [place], [place] [address]    Report Status 09/01/2020 FINAL  Final  SARS Coronavirus 2 by RT PCR (hospital order, performed in [facility] hospital lab) Nasopharyngeal Nasopharyngeal Swab     Status:  Abnormal   Collection Time: 08/27/20 11:08 AM   Specimen: Nasopharyngeal Swab  Result Value Ref Range Status   SARS Coronavirus 2 POSITIVE (A) NEGATIVE Final    Comment: RESULT CALLED TO, READ BACK BY AND VERIFIED WITH: MITCH BARKER AT 1310 ON 08/27/2020 [facility]. (NOTE) SARS-CoV-2 target nucleic acids are DETECTED  SARS-CoV-2 RNA is generally detectable in upper respiratory specimens  during the acute phase of infection.  Positive results are indicative  of the presence of the identified virus, but do not rule out bacterial infection or co-infection with other pathogens not detected by the test.  Clinical correlation with patient history and  other diagnostic information is necessary to determine patient infection status.  The expected result is negative.  Fact Sheet for Patients:   BoilerBrush.com.cy   Fact Sheet for Healthcare Providers:   https://pope.com/    This test is not yet approved or cleared by the [place] FDA and  has been authorized for detection and/or diagnosis of SARS-CoV-2 by FDA under an Emergency Use Authorization (EUA).  This EUA will remain in effect (meaning this  test can be used) for the duration of  the COVID-19 declaration under Section 564(b)(1) of the Act, 21 U.S.C. section 360-bbb-3(b)(1), unless the authorization is terminated or revoked sooner.  Performed at [facility], [address] [place], [place] [address]   Urine culture     Status: Abnormal   Collection Time: 08/27/20  3:52 PM   Specimen: In/Out Cath Urine  Result Value Ref Range Status   Specimen Description   Final    IN/OUT CATH URINE Performed at [facility], [address], [place], [place] [address]    Special Requests   Final    NONE Performed at [facility], [address] [place], [place] [address]    Culture MULTIPLE SPECIES PRESENT, SUGGEST RECOLLECTION (A)  Final   Report Status  08/29/2020 FINAL  Final      Management plans discussed with the patient, family and they are in agreement.  CODE STATUS:     Code Status Orders  (From admission, onward)         Start     Ordered   08/27/20 1344  Full code  Continuous  08/27/20 1346        Code Status History    This patient has a current code status but no historical code status.   Advance Care Planning Activity      TOTAL TIME TAKING CARE OF THIS PATIENT: 35 minutes.    Alford Highland M.D on 09/13/2020 at 5:45 PM  Between 7am to 6pm - Pager - [phone]  After 6pm go to [email or web address] - password [email or web address]  [facility]  CC: Primary care physician; Patient, No Pcp Per

## 2020-09-13 NOTE — Discharge Instructions (Signed)

## 2020-09-13 NOTE — Progress Notes (Signed)
SATURATION QUALIFICATIONS: (This note is used to comply with regulatory documentation for home oxygen)  Patient Saturations on Room Air at Rest = 90%  Patient Saturations on Room Air while Ambulating = 85%  Patient Saturations on 3 Liters of oxygen while Ambulating = 92%  Please briefly explain why patient needs home oxygen: oxygenation drops significantly while on room air with activity

## 2020-09-13 NOTE — Plan of Care (Signed)
  Problem: Education: Goal: Knowledge of [facility] Education information/materials will improve Outcome: Adequate for Discharge Goal: Emotional status will improve Outcome: Adequate for Discharge Goal: Mental status will improve Outcome: Adequate for Discharge Goal: Verbalization of understanding the information provided will improve Outcome: Adequate for Discharge   

## 2020-09-13 NOTE — TOC Initial Note (Signed)
Transition of Care [facility]) - Initial/Assessment Note    Patient Details  Name: Randy Ray MRN: [id number] Date of Birth: Dec 31, 1955  Transition of Care [facility]) CM/SW Contact:    Verna Czech [place], [place] Phone Number: [phone] 09/13/2020, 1:01 PM  Clinical Narrative:                 Patient to discharge home today. Patient's brother to provide the transportation home.  Patient will discharge home on oxygen. Patient confirms that he does not have insurance. [employer]([facility] [phone]) has accepted patient under charity care for the first 30 days. If oxygen is needed past the 30 days, patient can be covered under the Covid uninsured program. Patient verbalized an understanding of this plan.  Tuere Nwosu, LCSW Transition of Care [phone]    Expected Discharge Plan: Home/Self Care Barriers to Discharge: Barriers Resolved (patient unisured, oxygen to be billed under charity care and covid-unisured)   Patient Goals and CMS Choice        Expected Discharge Plan and Services Expected Discharge Plan: Home/Self Care In-house Referral: Clinical Social Work     Living arrangements for the past 2 months: Mobile Home Expected Discharge Date: 09/13/20                 DME Agency: [employer]                  Prior Living Arrangements/Services Living arrangements for the past 2 months: Mobile Home   Patient language and need for interpreter reviewed:: Yes Do you feel safe going back to the place where you live?: Yes      Need for Family Participation in Patient Care: No (Comment) Care giver support system in place?: Yes (comment)   Criminal Activity/Legal Involvement Pertinent to Current Situation/Hospitalization: No - Comment as needed  Activities of Daily Living Home Assistive Devices/Equipment: CBG Meter ADL Screening (condition at time of admission) Patient's cognitive ability adequate to safely complete daily activities?: Yes Is the patient deaf or have  difficulty hearing?: No Does the patient have difficulty seeing, even when wearing glasses/contacts?: No Does the patient have difficulty concentrating, remembering, or making decisions?: No Patient able to express need for assistance with ADLs?: No Does the patient have difficulty dressing or bathing?: No Independently performs ADLs?: Yes (appropriate for developmental age) Does the patient have difficulty walking or climbing stairs?: No Weakness of Legs: None Weakness of Arms/Hands: None  Permission Sought/Granted         Permission granted to share info w AGENCY: [employer]     Permission granted to share info w Contact Information: [phone]  Emotional Assessment   Attitude/Demeanor/Rapport: Engaged   Orientation: : Oriented to Self, Oriented to Place, Oriented to  Time, Oriented to Situation Alcohol / Substance Use: Not Applicable Psych Involvement: No (comment)  Admission diagnosis:  Pneumonia [J18.9] Hypoxia [R09.02] AKI (acute kidney injury) (HCC) [N17.9] Acute hypoxemic respiratory failure due to COVID-19 (HCC) [U07.1, J96.01] Pneumonia due to COVID-19 virus [U07.1, J12.82] COVID-19 [U07.1] Patient Active Problem List   Diagnosis Date Noted  . Chronic obstructive pulmonary disease (HCC)   . Aortic arch aneurysm (HCC)   . Acute hypoxemic respiratory failure due to COVID-19 (HCC) 08/28/2020  . AKI (acute kidney injury) (HCC) 08/28/2020  . Pneumonia due to COVID-19 virus 08/27/2020  . Thrombocytopenia (HCC) 08/27/2020  . Acute respiratory failure due to COVID-19 [facility]) 08/27/2020   PCP:  Patient, No Pcp Per Pharmacy:   [facility] [place], [place]  [address] [place] [address] Phone: [phone] Fax: [phone]     Social Determinants of Health (SDOH) Interventions    Readmission Risk Interventions No flowsheet data found.

## 2020-09-22 ENCOUNTER — Ambulatory Visit: Payer: Medicaid Other | Admitting: Family

## 2020-09-26 ENCOUNTER — Ambulatory Visit: Payer: Medicaid Other | Admitting: Family

## 2020-10-27 ENCOUNTER — Telehealth: Payer: Self-pay | Admitting: Pulmonary Disease

## 2020-10-27 ENCOUNTER — Ambulatory Visit (INDEPENDENT_AMBULATORY_CARE_PROVIDER_SITE_OTHER): Payer: Self-pay | Admitting: Pulmonary Disease

## 2020-10-27 ENCOUNTER — Encounter: Payer: Self-pay | Admitting: Pulmonary Disease

## 2020-10-27 ENCOUNTER — Other Ambulatory Visit: Payer: Self-pay

## 2020-10-27 VITALS — BP 148/82 | HR 89 | Temp 97.3°F | Ht 67.0 in | Wt 193.8 lb

## 2020-10-27 DIAGNOSIS — Z87891 Personal history of nicotine dependence: Secondary | ICD-10-CM

## 2020-10-27 DIAGNOSIS — Z8616 Personal history of COVID-19: Secondary | ICD-10-CM

## 2020-10-27 DIAGNOSIS — R0602 Shortness of breath: Secondary | ICD-10-CM

## 2020-10-27 DIAGNOSIS — J449 Chronic obstructive pulmonary disease, unspecified: Secondary | ICD-10-CM

## 2020-10-27 MED ORDER — ANORO ELLIPTA 62.5-25 MCG/INH IN AEPB: 1.0000 | INHALATION_SPRAY | Freq: Every day | RESPIRATORY_TRACT | 0 refills | Status: AC

## 2020-10-27 MED ORDER — ANORO ELLIPTA 62.5-25 MCG/INH IN AEPB: 1.0000 | INHALATION_SPRAY | Freq: Every day | RESPIRATORY_TRACT | 11 refills | Status: DC

## 2020-10-27 MED ORDER — ALBUTEROL SULFATE HFA 108 (90 BASE) MCG/ACT IN AERS: 2.0000 | INHALATION_SPRAY | Freq: Four times a day (QID) | RESPIRATORY_TRACT | 6 refills | Status: DC | PRN

## 2020-10-27 NOTE — Progress Notes (Signed)
Subjective:    Patient ID: Randy Ray, male    DOB: May 30, 1956, 64 y.o.   MRN: [id number]  HPI This is a 64 year old recent former smoker (08/2020, 100 pack years) who presents for follow-up after hospitalization for COVID-19 at the time patient also had acute respiratory failure with hypoxia. Referred by the inpatient hospitalist service. He does not have a primary care physician. He states that he has had dyspnea on exertion since his Covid diagnosis 2 months ago. He notes that long walks make him "winded" resting helps. He uses Combivent as needed which also helps but does not last long. He is not taking any other inhalers. He has not had any fevers, chills or sweats. His chart he has been weaned off of oxygen. He has not had any chest pain, paroxysmal nocturnal dyspnea or orthopnea. No lower extremity edema. He voices no other complaint.  He is a Visual merchandiser by trade. No military history. No other occupational exposures.   Review of Systems A 10 point review of systems was performed and it is as noted above otherwise negative.  Past Medical History:  Diagnosis Date  . COVID-19    Patient Active Problem List   Diagnosis Date Noted  . Chronic obstructive pulmonary disease (HCC)   . Aortic arch aneurysm (HCC)   . Acute hypoxemic respiratory failure due to COVID-19 (HCC) 08/28/2020  . AKI (acute kidney injury) (HCC) 08/28/2020  . Pneumonia due to COVID-19 virus 08/27/2020  . Thrombocytopenia (HCC) 08/27/2020  . Acute respiratory failure due to COVID-19 [facility]) 08/27/2020   History reviewed. No pertinent surgical history.  Family History  Problem Relation Age of Onset  . Cancer Mother   . Lung cancer Father    Social History   Tobacco Use  . Smoking status: Former Smoker    Packs/day: 2.00    Years: 50.00    Pack years: 100.00    Types: Cigarettes    Quit date: 08/2020    Years since quitting: 0.2  . Smokeless tobacco: Never Used  Substance Use Topics  . Alcohol use: Not  Currently   Allergies  Allergen Reactions  . Bee Pollen Shortness Of Breath   Current Meds  Medication Sig  . albuterol (VENTOLIN HFA) 108 (90 Base) MCG/ACT inhaler Inhale 2 puffs into the lungs every 6 (six) hours as needed for wheezing or shortness of breath.  . feeding supplement, ENSURE ENLIVE, (ENSURE ENLIVE) LIQD Take 237 mLs by mouth 3 (three) times daily between meals.  . zinc sulfate 220 (50 Zn) MG capsule Take 1 capsule (220 mg total) by mouth daily.  . [DISCONTINUED] albuterol (VENTOLIN HFA) 108 (90 Base) MCG/ACT inhaler Inhale 2 puffs into the lungs every 6 (six) hours as needed for wheezing or shortness of breath.  . [DISCONTINUED] Ipratropium-Albuterol (COMBIVENT RESPIMAT IN) Inhale 1 puff into the lungs in the morning and at bedtime.  . [DISCONTINUED] predniSONE (DELTASONE) 10 MG tablet 2 tabs po daily for three days the 1 tab po daily for three days then 1/2 tab po daily for four days    There is no immunization history on file for this patient.     Objective:   Physical Exam BP (!) 148/82 (BP Location: Left Arm, Cuff Size: Normal)   Pulse 89   Temp (!) 97.3 F (36.3 C) (Temporal)   Ht 5\' 7"  (1.702 m)   Wt 193 lb 12.8 oz (87.9 kg)   SpO2 93%   BMI 30.35 kg/m  GENERAL: Well-developed well-nourished gentleman  in no acute distress, fully ambulatory. HEAD: Normocephalic, atraumatic.  EYES: Pupils equal, round, reactive to light.  No scleral icterus.  MOUTH: Nose/mouth/throat not examined due to masking requirements for COVID 19. NECK: Supple. No thyromegaly. Trachea midline. No JVD.  No adenopathy. PULMONARY: Good air entry bilaterally. Coarse with no other adventitious sounds. CARDIOVASCULAR: S1 and S2. Regular rate and rhythm. No rubs, murmurs or gallops heard. ABDOMEN: Benign. MUSCULOSKELETAL: No joint deformity, no clubbing, no edema.  NEUROLOGIC: No focal deficit, speech is fluent, no gait disturbance. SKIN: Intact,warm,dry. Limited exam shows no  rashes PSYCH: Mood and behavior are normal.  I have reviewed the patient's available imaging. CT scan of the chest was performed on 09 September 2020 during his admission for COVID-19. CT showed COVID-19 pneumonia as well as emphysema.    Assessment & Plan:     ICD-10-CM   1. Shortness of breath  R06.02 Pulmonary Function Test [facility] Only   Will obtain PFTs Consider further imaging pending PFTs results  2. COPD suggested by initial evaluation (HCC)  J44.9    Trial of Anoro Ellipta 1 inhalation daily Switch rescue inhaler to albuterol Discontinue Combivent  3. Former heavy cigarette smoker (20-39 per day)  Z87.891    Commended on continued abstinence from tobacco  4. Personal history of COVID-19  Z86.16    This issue adds complexity to his management   Orders Placed This Encounter  Procedures  . Pulmonary Function Test [facility] Only    Standing Status:   Future    Standing Expiration Date:   10/27/2021    Order Specific Question:   Full PFT: includes the following: basic spirometry, spirometry pre & post bronchodilator, diffusion capacity (DLCO), lung volumes    Answer:   Full PFT   Meds ordered this encounter  Medications  . umeclidinium-vilanterol (ANORO ELLIPTA) 62.5-25 MCG/INH AEPB    Sig: Inhale 1 puff into the lungs daily for 1 day.    Dispense:  14 each    Refill:  0    Order Specific Question:   Lot Number?    Answer:   [id number]    Order Specific Question:   Expiration Date?    Answer:   08/27/2021    Order Specific Question:   Manufacturer?    Answer:   [employer] [12]    Order Specific Question:   Quantity    Answer:   2  . umeclidinium-vilanterol (ANORO ELLIPTA) 62.5-25 MCG/INH AEPB    Sig: Inhale 1 puff into the lungs daily.    Dispense:  30 each    Refill:  11  . albuterol (VENTOLIN HFA) 108 (90 Base) MCG/ACT inhaler    Sig: Inhale 2 puffs into the lungs every 6 (six) hours as needed for wheezing or shortness of breath.    Dispense:  8 g    Refill:  6     Smoking cessation instruction/counseling given:  commended patient for quitting and reviewed strategies for preventing relapses  Discussion:  Dyspnea post COVID-19 may be related to COPD need to however exclude potential residual fibrosis post COVID-19. PFTs should help elucidate this. We will determine if further imaging is necessary post PFTs. We will proceed with giving him a trial of Anoro Ellipta 62.5/25 1 inhalation daily and switch his rescue inhaler to albuterol. He will discontinue Combivent.  We have encouraged the patient to seek care of a primary care physician. We will see him in follow-up in 3 months time he is to contact us prior to that  time should any new difficulties arise.   Gailen Shelter, MD [facility]   *This note was dictated using voice recognition software/Dragon.  Despite best efforts to proofread, errors can occur which can change the meaning.  Any change was purely unintentional.

## 2020-10-27 NOTE — Patient Instructions (Addendum)
We are scheduling a breathing test.  We are giving you a trial of Anoro Ellipta that is an inhaler that you will use once daily, rinse your mouth well after you use it.  Do not use Combivent.  A new rescue inhaler is albuterol you can use that 2 puffs every 6 hours as needed.

## 2020-10-27 NOTE — Telephone Encounter (Addendum)
Called and spoke to patient's sister, Janie(DPR). Wille Celeste stated that patient's DME is [employer].  Patients chart has updated.  Nothing further needed.

## 2021-03-13 ENCOUNTER — Telehealth: Payer: Self-pay

## 2021-03-13 NOTE — Telephone Encounter (Signed)
Patient's sister, Derrill Kay) is aware of date/time of covid test prior to PFT.

## 2021-03-16 ENCOUNTER — Other Ambulatory Visit: Payer: MEDICAID

## 2021-03-17 ENCOUNTER — Other Ambulatory Visit: Payer: Self-pay

## 2021-03-17 ENCOUNTER — Other Ambulatory Visit
Admission: RE | Admit: 2021-03-17 | Discharge: 2021-03-17 | Disposition: A | Discharge status: Home / Self Care | Payer: Medicaid Other | Source: Ambulatory Visit | Attending: Pulmonary Disease | Admitting: Pulmonary Disease

## 2021-03-17 DIAGNOSIS — Z20822 Contact with and (suspected) exposure to covid-19: Secondary | ICD-10-CM | POA: Insufficient documentation

## 2021-03-17 DIAGNOSIS — Z01812 Encounter for preprocedural laboratory examination: Secondary | ICD-10-CM | POA: Insufficient documentation

## 2021-03-17 LAB — SARS CORONAVIRUS 2 (TAT 6-24 HRS): SARS Coronavirus 2: NEGATIVE

## 2021-03-18 ENCOUNTER — Ambulatory Visit: Payer: Medicaid Other | Attending: Pulmonary Disease

## 2021-03-18 ENCOUNTER — Other Ambulatory Visit: Payer: Self-pay

## 2021-03-18 DIAGNOSIS — R0602 Shortness of breath: Secondary | ICD-10-CM | POA: Insufficient documentation

## 2021-04-21 ENCOUNTER — Telehealth: Payer: Self-pay | Admitting: Pulmonary Disease
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
## 2022-02-19 ENCOUNTER — Emergency Department: Payer: Self-pay

## 2022-02-19 ENCOUNTER — Encounter: Payer: Self-pay | Admitting: Radiology

## 2022-02-19 ENCOUNTER — Other Ambulatory Visit: Payer: Self-pay

## 2022-02-19 ENCOUNTER — Observation Stay
Admission: EM | Admit: 2022-02-19 | Discharge: 2022-02-20 | Disposition: A | Discharge status: Home / Self Care | Payer: Self-pay | Attending: Internal Medicine | Admitting: Internal Medicine

## 2022-02-19 DIAGNOSIS — J441 Chronic obstructive pulmonary disease with (acute) exacerbation: Secondary | ICD-10-CM

## 2022-02-19 DIAGNOSIS — Y92009 Unspecified place in unspecified non-institutional (private) residence as the place of occurrence of the external cause: Secondary | ICD-10-CM | POA: Insufficient documentation

## 2022-02-19 DIAGNOSIS — Z8616 Personal history of COVID-19: Secondary | ICD-10-CM | POA: Insufficient documentation

## 2022-02-19 DIAGNOSIS — T59811A Toxic effect of smoke, accidental (unintentional), initial encounter: Principal | ICD-10-CM | POA: Diagnosis present

## 2022-02-19 DIAGNOSIS — J705 Respiratory conditions due to smoke inhalation: Secondary | ICD-10-CM | POA: Insufficient documentation

## 2022-02-19 DIAGNOSIS — R0902 Hypoxemia: Secondary | ICD-10-CM

## 2022-02-19 DIAGNOSIS — R0600 Dyspnea, unspecified: Secondary | ICD-10-CM

## 2022-02-19 DIAGNOSIS — Z87891 Personal history of nicotine dependence: Secondary | ICD-10-CM | POA: Insufficient documentation

## 2022-02-19 DIAGNOSIS — Z20822 Contact with and (suspected) exposure to covid-19: Secondary | ICD-10-CM | POA: Insufficient documentation

## 2022-02-19 HISTORY — DX: Chronic obstructive pulmonary disease, unspecified: J44.9

## 2022-02-19 LAB — BASIC METABOLIC PANEL
Anion gap: 12 (ref 5–15)
BUN: 20 mg/dL (ref 8–23)
CO2: 18 mmol/L — ABNORMAL LOW (ref 22–32)
Calcium: 8.9 mg/dL (ref 8.9–10.3)
Chloride: 117 mmol/L — ABNORMAL HIGH (ref 98–111)
Creatinine, Ser: 1.27 mg/dL — ABNORMAL HIGH (ref 0.61–1.24)
GFR, Estimated: >60 mL/min (ref >60)
Glucose, Bld: 99 mg/dL (ref 70–99)
Potassium: 4.1 mmol/L (ref 3.5–5.1)
Sodium: 147 mmol/L — ABNORMAL HIGH (ref 135–145)

## 2022-02-19 LAB — CBC WITH DIFFERENTIAL/PLATELET
Abs Immature Granulocytes: 0.01 10*3/uL (ref 0.00–0.07)
Basophils Absolute: 0 10*3/uL (ref 0.0–0.1)
Basophils Relative: 0 %
Eosinophils Absolute: 0.1 10*3/uL (ref 0.0–0.5)
Eosinophils Relative: 2 %
HCT: 38.9 % — ABNORMAL LOW (ref 39.0–52.0)
Hemoglobin: 12.6 g/dL — ABNORMAL LOW (ref 13.0–17.0)
Immature Granulocytes: 0 %
Lymphocytes Relative: 33 %
Lymphs Abs: 1.6 10*3/uL (ref 0.7–4.0)
MCH: 29.4 pg (ref 26.0–34.0)
MCHC: 32.4 g/dL (ref 30.0–36.0)
MCV: 90.9 fL (ref 80.0–100.0)
Monocytes Absolute: 0.3 10*3/uL (ref 0.1–1.0)
Monocytes Relative: 6 %
Neutro Abs: 2.8 10*3/uL (ref 1.7–7.7)
Neutrophils Relative %: 59 %
Platelets: 209 10*3/uL (ref 150–400)
RBC: 4.28 MIL/uL (ref 4.22–5.81)
RDW: 13.6 % (ref 11.5–15.5)
WBC: 4.8 10*3/uL (ref 4.0–10.5)
nRBC: 0 % (ref 0.0–0.2)

## 2022-02-19 LAB — BLOOD GAS, ARTERIAL
Acid-base deficit: 6 mmol/L — ABNORMAL HIGH (ref 0.0–2.0)
Bicarbonate: 18.8 mmol/L — ABNORMAL LOW (ref 20.0–28.0)
O2 Saturation: 68.7 %
Patient temperature: 37
pCO2 arterial: 34 mmHg (ref 32–48)
pH, Arterial: 7.35 (ref 7.35–7.45)
pO2, Arterial: 44 mmHg — ABNORMAL LOW (ref 83–108)

## 2022-02-19 LAB — RESP PANEL BY RT-PCR (FLU A&B, COVID) ARPGX2
Influenza A by PCR: NEGATIVE
Influenza B by PCR: NEGATIVE
SARS Coronavirus 2 by RT PCR: NEGATIVE

## 2022-02-19 LAB — CBC
HCT: 39.9 % (ref 39.0–52.0)
Hemoglobin: 12.7 g/dL — ABNORMAL LOW (ref 13.0–17.0)
MCH: 29.3 pg (ref 26.0–34.0)
MCHC: 31.8 g/dL (ref 30.0–36.0)
MCV: 92.1 fL (ref 80.0–100.0)
Platelets: 200 10*3/uL (ref 150–400)
RBC: 4.33 MIL/uL (ref 4.22–5.81)
RDW: 13.7 % (ref 11.5–15.5)
WBC: 4.4 10*3/uL (ref 4.0–10.5)
nRBC: 0 % (ref 0.0–0.2)

## 2022-02-19 LAB — CREATININE, SERUM
Creatinine, Ser: 1.22 mg/dL (ref 0.61–1.24)
GFR, Estimated: >60 mL/min (ref >60)

## 2022-02-19 LAB — ETHANOL: Alcohol, Ethyl (B): 109 mg/dL — ABNORMAL HIGH (ref <10)

## 2022-02-19 LAB — COOXEMETRY PANEL
Carboxyhemoglobin: 2.2 % — ABNORMAL HIGH (ref 0.5–1.5)
Methemoglobin: <0.7 % (ref 0.0–1.5)
O2 Saturation: 75.5 %
Total hemoglobin: 13.3 g/dL (ref 12.0–16.0)
Total oxygen content: 73.4 mL/dL

## 2022-02-19 LAB — LACTATE DEHYDROGENASE: LDH: 162 U/L (ref 98–192)

## 2022-02-19 MED ORDER — ACETAMINOPHEN 325 MG RE SUPP: 650.0000 mg | Freq: Four times a day (QID) | RECTAL | Status: DC | PRN

## 2022-02-19 MED ORDER — POLYETHYLENE GLYCOL 3350 17 G PO PACK: 17.0000 g | PACK | Freq: Every day | ORAL | Status: DC | PRN

## 2022-02-19 MED ORDER — ENOXAPARIN SODIUM 40 MG/0.4ML IJ SOSY
40.0000 mg | PREFILLED_SYRINGE | INTRAMUSCULAR | Status: DC
  Administered 2022-02-19: 40 mg via SUBCUTANEOUS
  Filled 2022-02-19: qty 0.4

## 2022-02-19 MED ORDER — IPRATROPIUM-ALBUTEROL 0.5-2.5 (3) MG/3ML IN SOLN
3.0000 mL | Freq: Four times a day (QID) | RESPIRATORY_TRACT | Status: DC
Start: 2022-02-19 — End: 2022-02-20
  Administered 2022-02-19: 3 mL via RESPIRATORY_TRACT
  Filled 2022-02-19: qty 3

## 2022-02-19 MED ORDER — ONDANSETRON HCL 4 MG PO TABS: 4.0000 mg | ORAL_TABLET | Freq: Four times a day (QID) | ORAL | Status: DC | PRN

## 2022-02-19 MED ORDER — ONDANSETRON HCL 4 MG/2ML IJ SOLN: 4.0000 mg | Freq: Four times a day (QID) | INTRAMUSCULAR | Status: DC | PRN

## 2022-02-19 MED ORDER — IPRATROPIUM-ALBUTEROL 0.5-2.5 (3) MG/3ML IN SOLN
3.0000 mL | Freq: Once | RESPIRATORY_TRACT | Status: AC
  Administered 2022-02-19: 3 mL via RESPIRATORY_TRACT
  Filled 2022-02-19: qty 3

## 2022-02-19 MED ORDER — ADULT MULTIVITAMIN W/MINERALS CH
1.0000 | ORAL_TABLET | Freq: Every day | ORAL | Status: DC
  Administered 2022-02-19: 1 via ORAL
  Filled 2022-02-19: qty 1

## 2022-02-19 MED ORDER — ACETAMINOPHEN 325 MG PO TABS: 650.0000 mg | ORAL_TABLET | Freq: Four times a day (QID) | ORAL | Status: DC | PRN

## 2022-02-19 MED ORDER — LACTATED RINGERS IV SOLN: INTRAVENOUS | Status: DC
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
## 2022-02-19 NOTE — ED Notes (Signed)
Patient resting comfortably on stretcher. RR even and unlabored. Patient remains on oxygen via [place] at 2lpm. Patient has no c/o shob at this time. Patient's IVF started and blood drawn, sent to lab. Patient provided with beverage and blanket per request.
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
## 2022-02-20 LAB — COMPREHENSIVE METABOLIC PANEL
ALT: 18 U/L (ref 0–44)
AST: 23 U/L (ref 15–41)
Albumin: 3.7 g/dL (ref 3.5–5.0)
Alkaline Phosphatase: 49 U/L (ref 38–126)
Anion gap: 10 (ref 5–15)
BUN: 20 mg/dL (ref 8–23)
CO2: 21 mmol/L — ABNORMAL LOW (ref 22–32)
Calcium: 8.7 mg/dL — ABNORMAL LOW (ref 8.9–10.3)
Chloride: 110 mmol/L (ref 98–111)
Creatinine, Ser: 0.97 mg/dL (ref 0.61–1.24)
GFR, Estimated: >60 mL/min (ref >60)
Glucose, Bld: 91 mg/dL (ref 70–99)
Potassium: 4.4 mmol/L (ref 3.5–5.1)
Sodium: 141 mmol/L (ref 135–145)
Total Bilirubin: 1 mg/dL (ref 0.3–1.2)
Total Protein: 7.1 g/dL (ref 6.5–8.1)

## 2022-02-20 LAB — COOXEMETRY PANEL
Carboxyhemoglobin: 1.7 % — ABNORMAL HIGH (ref 0.5–1.5)
Methemoglobin: 0.7 % (ref 0.0–1.5)
O2 Saturation: 96.5 %
Total hemoglobin: 12.9 g/dL (ref 12.0–16.0)
Total oxygen content: 94.2 mL/dL

## 2022-02-20 LAB — HIV ANTIBODY (ROUTINE TESTING W REFLEX): HIV Screen 4th Generation wRfx: NONREACTIVE

## 2022-02-20 MED ORDER — ADULT MULTIVITAMIN W/MINERALS CH: 1.0000 | ORAL_TABLET | Freq: Every day | ORAL | 0 refills | Status: DC

## 2022-02-20 NOTE — ED Notes (Signed)
Patient resting comfortably on stretcher in room with eyes closed. RR even and unlabored. Patient verbalizes no needs or complaints at this time.  °

## 2022-02-20 NOTE — ED Notes (Signed)
Patient resting comfortably on stretcher in room with eyes closed. RR even and unlabored. Patient verbalizes no needs or complaints at this time.

## 2022-02-20 NOTE — ED Notes (Signed)
Patient resting comfortably on stretcher in room with eyes closed. RR even and unlabored. Patient verbalizes no complaints at this time. He was repositioned for comfort and lights turned off per request.

## 2022-02-20 NOTE — ED Notes (Signed)
Patient resting comfortably on stretcher in room. RR even and unlabored. Patient requesting blanket and beverage which were provided. Patient has no complaints at this time.

## 2022-02-20 NOTE — ED Notes (Signed)
Ambulated patient. Oxygen saturation did not drop. MD made aware.

## 2022-02-20 NOTE — ED Notes (Signed)
Patient resting comfortably on stretcher in room. RR even and unlabored. Patient verbalizes no needs or complaints at this time.

## 2022-02-20 NOTE — ED Notes (Signed)
Patient resting comfortably on stretcher. RR even and unlabored. Patient requesting a beverage after his meds and breathing treatment which was provided. Patient's lung fields reveal some expiratory wheezing but sats remains >95% at this time.

## 2022-02-20 NOTE — ED Notes (Addendum)
Patient resting comfortably on stretcher. RR even and unlabored. Patient verbalizes no complaints but does request beverage which was provided. Patient's labs where obtained and sent to lab. Respiratory notified to come draw Cooxemetry Panel.
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
## 2022-02-20 NOTE — ED Notes (Signed)
Called respiratory for COAX panel.

## 2022-02-20 NOTE — ED Notes (Signed)
Rn to bedside to introduce self to pt. Pt is CAOx4 and in no acute distress.

## 2022-03-25 IMAGING — DX DG CHEST 1V PORT
2 series · 2 of 2 positions shown · non-contrast
Comparison: None

CLINICAL DATA: Dyspnea, dry cough for a week.

EXAM:
PORTABLE CHEST 1 VIEW

[chest ap (1 of 2)]
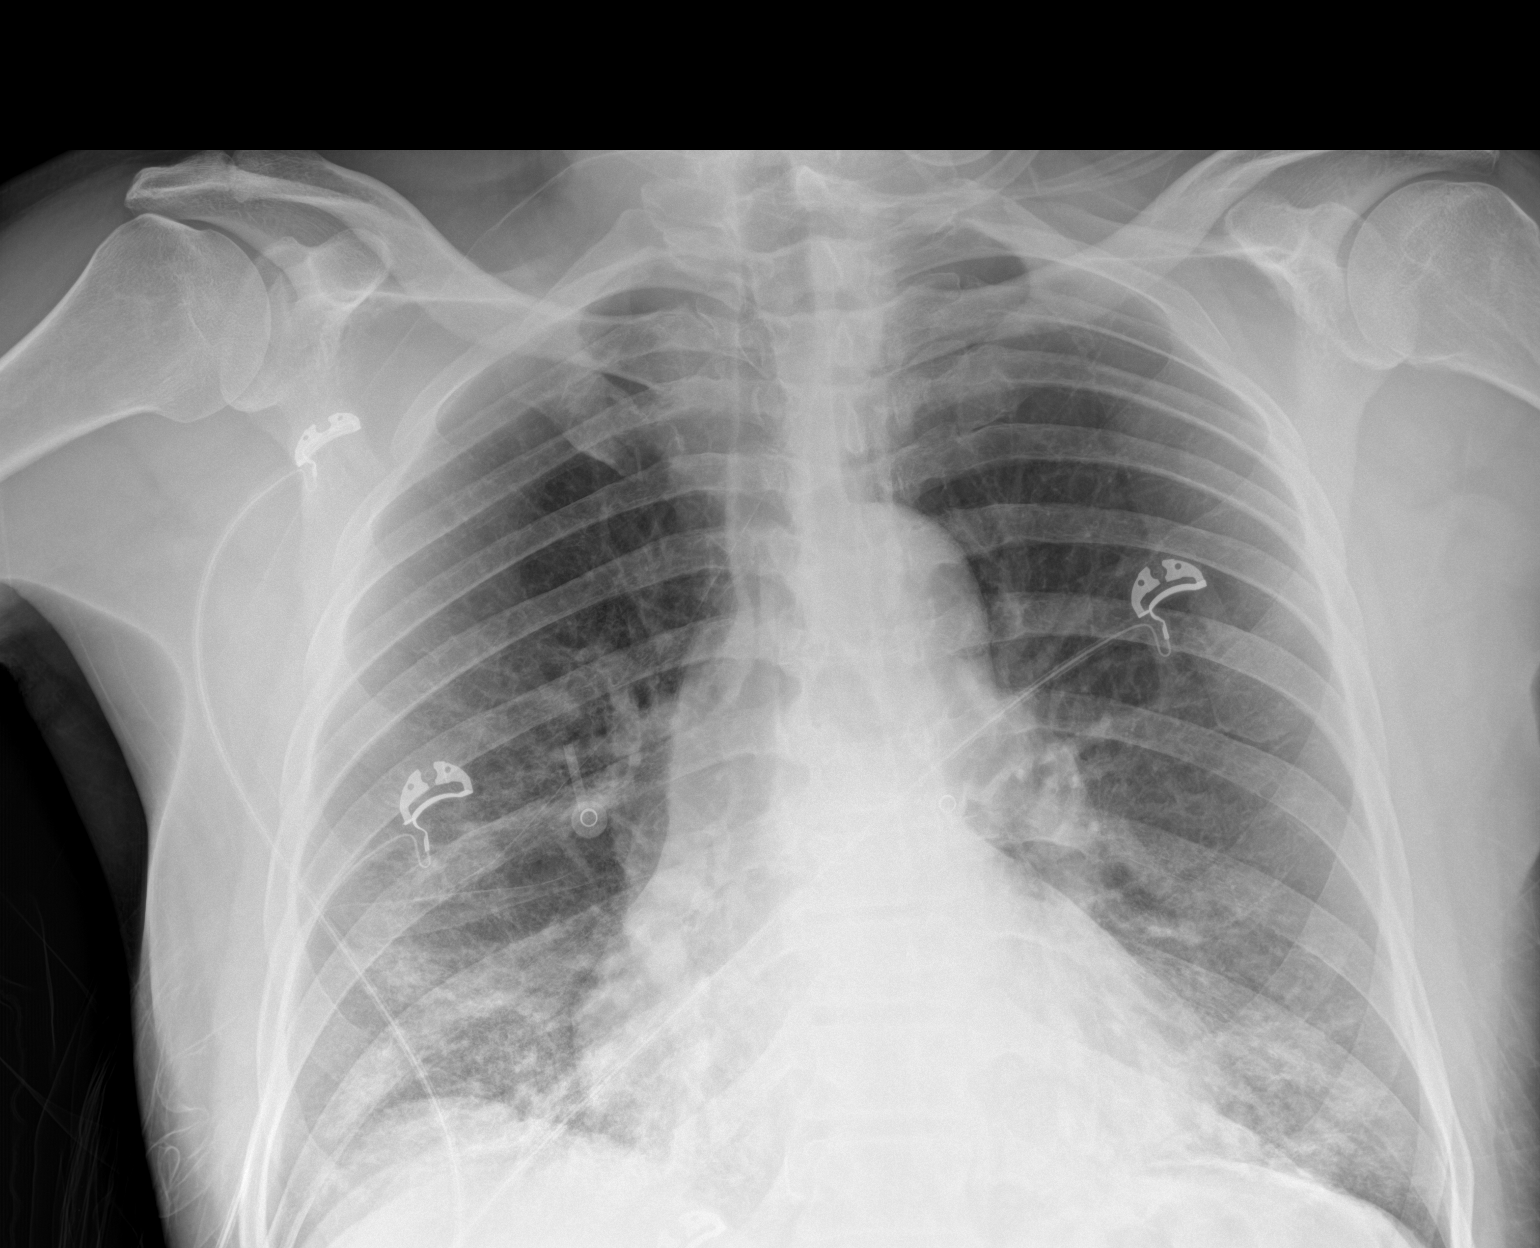

[chest ap (2 of 2)]
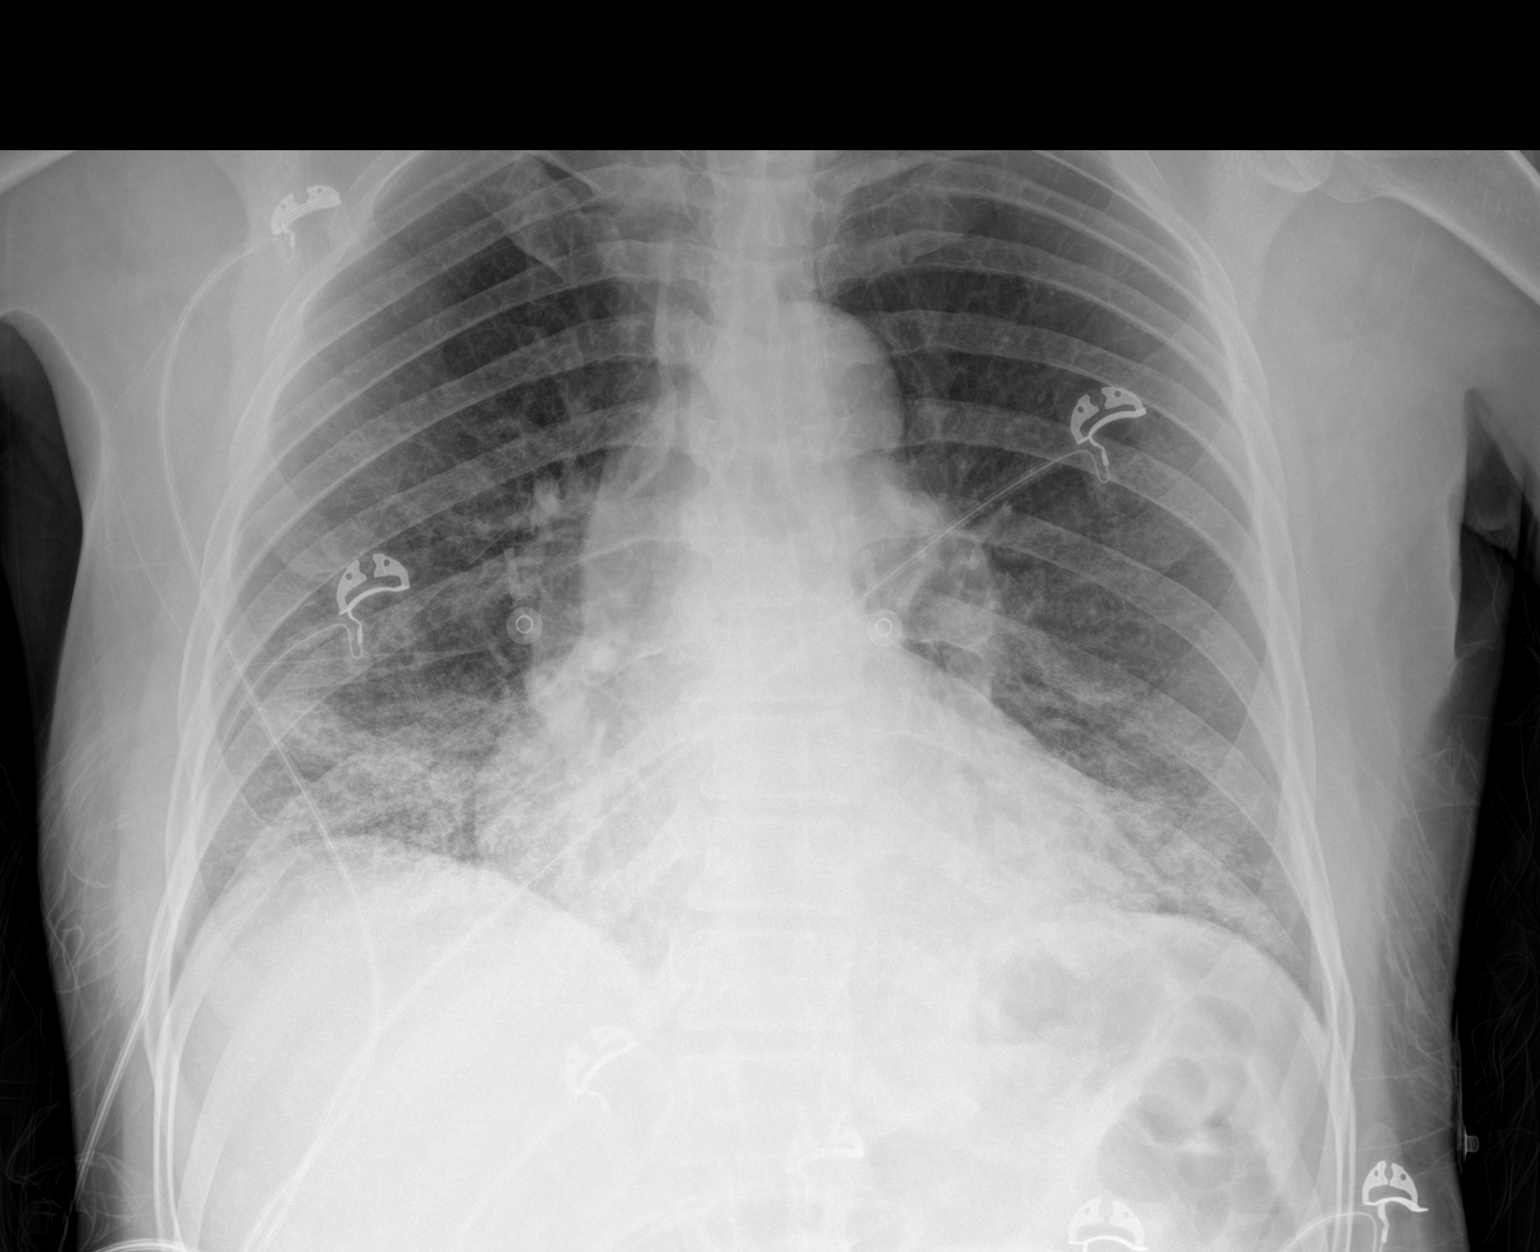

[2 of 2 positions shown; findings below may reference images not displayed]

FINDINGS: Image rotated slightly to the RIGHT. Accounting for this the
cardiomediastinal contours and hilar structures are normal.

Patchy basilar airspace disease bilaterally with both interstitial
and airspace component.

No sign of pleural effusion.

On limited assessment skeletal structures without acute process.
IMPRESSION: Patchy basilar airspace disease bilaterally with both interstitial
and airspace component. Findings could be seen in the setting of
atypical or multifocal pneumonia. UUP15-IR infection could be
considered.

## 2023-03-24 ENCOUNTER — Emergency Department: Payer: Medicare Other

## 2023-03-24 ENCOUNTER — Emergency Department
Admission: EM | Admit: 2023-03-24 | Discharge: 2023-03-24 | Disposition: A | Discharge status: Home / Self Care | Payer: Medicare Other | Attending: Emergency Medicine | Admitting: Emergency Medicine

## 2023-03-24 ENCOUNTER — Other Ambulatory Visit: Payer: Self-pay

## 2023-03-24 DIAGNOSIS — R200 Anesthesia of skin: Secondary | ICD-10-CM | POA: Insufficient documentation

## 2023-03-24 DIAGNOSIS — M25512 Pain in left shoulder: Secondary | ICD-10-CM | POA: Insufficient documentation

## 2023-03-24 DIAGNOSIS — Z8616 Personal history of COVID-19: Secondary | ICD-10-CM | POA: Diagnosis not present

## 2023-03-24 DIAGNOSIS — J449 Chronic obstructive pulmonary disease, unspecified: Secondary | ICD-10-CM | POA: Diagnosis not present

## 2023-03-24 MED ORDER — IBUPROFEN 600 MG PO TABS
600.0000 mg | ORAL_TABLET | Freq: Once | ORAL | Status: AC
  Administered 2023-03-24: 600 mg via ORAL
  Filled 2023-03-24: qty 1

## 2023-03-24 MED ORDER — NAPROXEN 500 MG PO TABS: 500.0000 mg | ORAL_TABLET | Freq: Two times a day (BID) | ORAL | 0 refills | Status: DC

## 2023-03-24 NOTE — ED Triage Notes (Signed)
Pt presents to ED via [facility] with c/o of L hand numbness for 1 week. Pt states he dislocated his L shoulder. Pt states he was not seen for this and "I put it back in by myself", pt states numbness ever since.   Pt states he is from home.

## 2023-03-24 NOTE — Discharge Instructions (Signed)
Call make an appoint with Dr. Mack Guise who is the orthopedist on-call if you continue to have problems with your shoulder.  A prescription for naproxen 500 mg twice a day with food was written for you to take to your pharmacy.  You may also use ice or heat to your shoulder as needed for discomfort.

## 2023-03-24 NOTE — ED Provider Notes (Signed)
[facility] Provider Note    Event Date/Time   First MD Initiated Contact with Patient 03/24/23 1015     (approximate)   History   Numbness   HPI  Randy Ray is a 67 y.o. male   presents to the ED via EMS with complaint of left upper extremity numbness for 1 week.  Patient reports that he thinks that he dislocated his shoulder approximately 2 weeks ago and he "put it back myself".  He reports that he was working underneath a car when this occurred.  He reports he has had some numbness since that time and this is the first time he is sought medical help.  No over-the-counter medications have been taken.  Patient has a history of COPD, acute respiratory failure secondary to COVID and aortic arch aneurysm.      Physical Exam   Triage Vital Signs: ED Triage Vitals [03/24/23 1006]  Enc Vitals Group     BP (!) 102/59     Pulse Rate 86     Resp 18     Temp 97.8 F (36.6 C)     Temp Source Oral     SpO2 98 %     Weight      Height      Head Circumference      Peak Flow      Pain Score      Pain Loc      Pain Edu?      Excl. in [place]?     Most recent vital signs: Vitals:   03/24/23 1006  BP: (!) 102/59  Pulse: 86  Resp: 18  Temp: 97.8 F (36.6 C)  SpO2: 98%     General: Awake, no distress.  CV:  Good peripheral perfusion.  Heart regular rate and rhythm. Resp:  Normal effort.  Lungs are clear bilaterally. Abd:  No distention.  Other:  Examination of the left shoulder there is no gross deformity, skin discoloration or step-offs on palpation.  Patient is able to move left upper extremity without any difficulty or assistance.  Radial pulse present.  Skin is intact.  No discoloration present.  Patient grip strength is equal bilaterally.   ED Results / Procedures / Treatments   Labs (all labs ordered are listed, but only abnormal results are displayed) Labs Reviewed - No data to display    RADIOLOGY Left shoulder x-ray images were  reviewed and interpreted by myself independent of the radiologist is negative for dislocation or fracture.    PROCEDURES:  Critical Care performed:   Procedures   MEDICATIONS ORDERED IN ED: Medications  ibuprofen (ADVIL) tablet 600 mg (600 mg Oral Given 03/24/23 1108)     IMPRESSION / MDM / ASSESSMENT AND PLAN / ED COURSE  I reviewed the triage vital signs and the nursing notes.   Differential diagnosis includes, but is not limited to, left shoulder fracture, dislocation, chronic pain, radiculopathy, nerve impingement.  67 year old male presents to the ED with a history of left shoulder dislocation approximately 2 weeks ago in which he reduced his shoulder on his own.  Patient denied any injury at that time and this is his initial visit for his left shoulder.  X-rays today were reassuring and patient was made aware.  No over-the-counter medications has been taken.  Patient was given ibuprofen while in the emergency department.  A prescription for naproxen 500 mg twice daily with food was sent to his pharmacy and patient is to follow-up with  Dr. Mack Guise if he continues to have problems with his shoulder.    Clinical Course as of 03/24/23 1433  Thu Mar 24, 2023  1015 DG Shoulder 1 View Left [RS]    Clinical Course User Index [RS] Johnn Hai, PA-C   Patient's presentation is most consistent with acute complicated illness / injury requiring diagnostic workup.  FINAL CLINICAL IMPRESSION(S) / ED DIAGNOSES   Final diagnoses:  Acute pain of left shoulder     Rx / DC Orders   ED Discharge Orders          Ordered    naproxen (NAPROSYN) 500 MG tablet  2 times daily with meals        03/24/23 1146             Note:  This document was prepared using Dragon voice recognition software and may include unintentional dictation errors.   Johnn Hai, PA-C 03/24/23 1433    Delman Kitten, MD 03/25/23 1153

## 2023-03-26 ENCOUNTER — Emergency Department: Payer: Medicare Other

## 2023-03-26 ENCOUNTER — Other Ambulatory Visit: Payer: Self-pay | Admitting: Internal Medicine

## 2023-03-26 ENCOUNTER — Encounter: Payer: Self-pay | Admitting: Emergency Medicine

## 2023-03-26 ENCOUNTER — Emergency Department
Admission: EM | Admit: 2023-03-26 | Discharge: 2023-03-26 | Disposition: A | Discharge status: Other Acute Inpatient Hospital | Payer: Medicare Other | Attending: Emergency Medicine | Admitting: Emergency Medicine

## 2023-03-26 ENCOUNTER — Inpatient Hospital Stay (HOSPITAL_COMMUNITY)
Admission: AD | Admit: 2023-03-26 | Discharge: 2023-04-27 | DRG: 987 | Disposition: E | Payer: Medicare Other | Source: Other Acute Inpatient Hospital | Attending: Internal Medicine | Admitting: Internal Medicine

## 2023-03-26 ENCOUNTER — Inpatient Hospital Stay (HOSPITAL_COMMUNITY): Payer: Medicare Other

## 2023-03-26 ENCOUNTER — Encounter (HOSPITAL_COMMUNITY): Payer: Self-pay

## 2023-03-26 ENCOUNTER — Other Ambulatory Visit: Payer: Self-pay

## 2023-03-26 DIAGNOSIS — J449 Chronic obstructive pulmonary disease, unspecified: Secondary | ICD-10-CM | POA: Diagnosis present

## 2023-03-26 DIAGNOSIS — G40101 Localization-related (focal) (partial) symptomatic epilepsy and epileptic syndromes with simple partial seizures, not intractable, with status epilepticus: Principal | ICD-10-CM | POA: Diagnosis present

## 2023-03-26 DIAGNOSIS — Z79899 Other long term (current) drug therapy: Secondary | ICD-10-CM

## 2023-03-26 DIAGNOSIS — R918 Other nonspecific abnormal finding of lung field: Secondary | ICD-10-CM | POA: Diagnosis not present

## 2023-03-26 DIAGNOSIS — R451 Restlessness and agitation: Secondary | ICD-10-CM | POA: Diagnosis not present

## 2023-03-26 DIAGNOSIS — F1410 Cocaine abuse, uncomplicated: Secondary | ICD-10-CM | POA: Diagnosis not present

## 2023-03-26 DIAGNOSIS — G928 Other toxic encephalopathy: Secondary | ICD-10-CM | POA: Diagnosis present

## 2023-03-26 DIAGNOSIS — R001 Bradycardia, unspecified: Secondary | ICD-10-CM | POA: Diagnosis present

## 2023-03-26 DIAGNOSIS — E7220 Disorder of urea cycle metabolism, unspecified: Secondary | ICD-10-CM | POA: Diagnosis present

## 2023-03-26 DIAGNOSIS — G40001 Localization-related (focal) (partial) idiopathic epilepsy and epileptic syndromes with seizures of localized onset, not intractable, with status epilepticus: Secondary | ICD-10-CM

## 2023-03-26 DIAGNOSIS — F05 Delirium due to known physiological condition: Secondary | ICD-10-CM | POA: Diagnosis not present

## 2023-03-26 DIAGNOSIS — C3490 Malignant neoplasm of unspecified part of unspecified bronchus or lung: Secondary | ICD-10-CM | POA: Diagnosis not present

## 2023-03-26 DIAGNOSIS — N10 Acute tubulo-interstitial nephritis: Secondary | ICD-10-CM | POA: Diagnosis not present

## 2023-03-26 DIAGNOSIS — G9341 Metabolic encephalopathy: Secondary | ICD-10-CM | POA: Diagnosis present

## 2023-03-26 DIAGNOSIS — F17210 Nicotine dependence, cigarettes, uncomplicated: Secondary | ICD-10-CM | POA: Diagnosis present

## 2023-03-26 DIAGNOSIS — I10 Essential (primary) hypertension: Secondary | ICD-10-CM | POA: Insufficient documentation

## 2023-03-26 DIAGNOSIS — G40901 Epilepsy, unspecified, not intractable, with status epilepticus: Principal | ICD-10-CM | POA: Diagnosis present

## 2023-03-26 DIAGNOSIS — J439 Emphysema, unspecified: Secondary | ICD-10-CM | POA: Diagnosis present

## 2023-03-26 DIAGNOSIS — Z66 Do not resuscitate: Secondary | ICD-10-CM | POA: Diagnosis present

## 2023-03-26 DIAGNOSIS — F1413 Cocaine abuse, unspecified with withdrawal: Secondary | ICD-10-CM | POA: Diagnosis not present

## 2023-03-26 DIAGNOSIS — N2889 Other specified disorders of kidney and ureter: Secondary | ICD-10-CM | POA: Diagnosis not present

## 2023-03-26 DIAGNOSIS — R569 Unspecified convulsions: Secondary | ICD-10-CM

## 2023-03-26 DIAGNOSIS — C7951 Secondary malignant neoplasm of bone: Secondary | ICD-10-CM | POA: Diagnosis present

## 2023-03-26 DIAGNOSIS — G8194 Hemiplegia, unspecified affecting left nondominant side: Secondary | ICD-10-CM | POA: Diagnosis present

## 2023-03-26 DIAGNOSIS — N390 Urinary tract infection, site not specified: Secondary | ICD-10-CM | POA: Diagnosis present

## 2023-03-26 DIAGNOSIS — B9620 Unspecified Escherichia coli [E. coli] as the cause of diseases classified elsewhere: Secondary | ICD-10-CM | POA: Diagnosis present

## 2023-03-26 DIAGNOSIS — R471 Dysarthria and anarthria: Secondary | ICD-10-CM | POA: Diagnosis present

## 2023-03-26 DIAGNOSIS — Z515 Encounter for palliative care: Secondary | ICD-10-CM | POA: Diagnosis not present

## 2023-03-26 DIAGNOSIS — I619 Nontraumatic intracerebral hemorrhage, unspecified: Secondary | ICD-10-CM | POA: Diagnosis present

## 2023-03-26 DIAGNOSIS — G939 Disorder of brain, unspecified: Secondary | ICD-10-CM | POA: Diagnosis not present

## 2023-03-26 DIAGNOSIS — C771 Secondary and unspecified malignant neoplasm of intrathoracic lymph nodes: Secondary | ICD-10-CM | POA: Diagnosis present

## 2023-03-26 DIAGNOSIS — R900 Intracranial space-occupying lesion found on diagnostic imaging of central nervous system: Secondary | ICD-10-CM

## 2023-03-26 DIAGNOSIS — C7931 Secondary malignant neoplasm of brain: Secondary | ICD-10-CM | POA: Diagnosis present

## 2023-03-26 DIAGNOSIS — Z801 Family history of malignant neoplasm of trachea, bronchus and lung: Secondary | ICD-10-CM

## 2023-03-26 DIAGNOSIS — E663 Overweight: Secondary | ICD-10-CM | POA: Diagnosis present

## 2023-03-26 DIAGNOSIS — G936 Cerebral edema: Secondary | ICD-10-CM | POA: Diagnosis present

## 2023-03-26 DIAGNOSIS — Z91030 Bee allergy status: Secondary | ICD-10-CM

## 2023-03-26 DIAGNOSIS — C3402 Malignant neoplasm of left main bronchus: Secondary | ICD-10-CM

## 2023-03-26 DIAGNOSIS — Z6828 Body mass index (BMI) 28.0-28.9, adult: Secondary | ICD-10-CM

## 2023-03-26 DIAGNOSIS — Z8616 Personal history of COVID-19: Secondary | ICD-10-CM | POA: Diagnosis not present

## 2023-03-26 DIAGNOSIS — R8281 Pyuria: Secondary | ICD-10-CM | POA: Diagnosis not present

## 2023-03-26 DIAGNOSIS — F10239 Alcohol dependence with withdrawal, unspecified: Secondary | ICD-10-CM | POA: Diagnosis not present

## 2023-03-26 DIAGNOSIS — Z781 Physical restraint status: Secondary | ICD-10-CM | POA: Diagnosis not present

## 2023-03-26 DIAGNOSIS — Z7189 Other specified counseling: Secondary | ICD-10-CM | POA: Diagnosis not present

## 2023-03-26 LAB — URINALYSIS, W/ REFLEX TO CULTURE (INFECTION SUSPECTED)
Bilirubin Urine: NEGATIVE
Glucose, UA: NEGATIVE mg/dL
Hgb urine dipstick: NEGATIVE
Ketones, ur: NEGATIVE mg/dL
Nitrite: POSITIVE — AB
Protein, ur: NEGATIVE mg/dL
Specific Gravity, Urine: >1.046 — ABNORMAL HIGH (ref 1.005–1.030)
pH: 5 (ref 5.0–8.0)

## 2023-03-26 LAB — CBC WITH DIFFERENTIAL/PLATELET
Abs Immature Granulocytes: 0.01 10*3/uL (ref 0.00–0.07)
Basophils Absolute: 0 10*3/uL (ref 0.0–0.1)
Basophils Relative: 0 %
Eosinophils Absolute: 0.1 10*3/uL (ref 0.0–0.5)
Eosinophils Relative: 2 %
HCT: 37.6 % — ABNORMAL LOW (ref 39.0–52.0)
Hemoglobin: 12 g/dL — ABNORMAL LOW (ref 13.0–17.0)
Immature Granulocytes: 0 %
Lymphocytes Relative: 24 %
Lymphs Abs: 1.4 10*3/uL (ref 0.7–4.0)
MCH: 28 pg (ref 26.0–34.0)
MCHC: 31.9 g/dL (ref 30.0–36.0)
MCV: 87.9 fL (ref 80.0–100.0)
Monocytes Absolute: 0.4 10*3/uL (ref 0.1–1.0)
Monocytes Relative: 6 %
Neutro Abs: 4 10*3/uL (ref 1.7–7.7)
Neutrophils Relative %: 68 %
Platelets: 303 10*3/uL (ref 150–400)
RBC: 4.28 MIL/uL (ref 4.22–5.81)
RDW: 13.7 % (ref 11.5–15.5)
WBC: 5.9 10*3/uL (ref 4.0–10.5)
nRBC: 0 % (ref 0.0–0.2)

## 2023-03-26 LAB — COMPREHENSIVE METABOLIC PANEL
ALT: 10 U/L (ref 0–44)
AST: 17 U/L (ref 15–41)
Albumin: 3.8 g/dL (ref 3.5–5.0)
Alkaline Phosphatase: 57 U/L (ref 38–126)
Anion gap: 9 (ref 5–15)
BUN: 14 mg/dL (ref 8–23)
CO2: 24 mmol/L (ref 22–32)
Calcium: 9.6 mg/dL (ref 8.9–10.3)
Chloride: 107 mmol/L (ref 98–111)
Creatinine, Ser: 1 mg/dL (ref 0.61–1.24)
GFR, Estimated: >60 mL/min (ref >60)
Glucose, Bld: 104 mg/dL — ABNORMAL HIGH (ref 70–99)
Potassium: 4.1 mmol/L (ref 3.5–5.1)
Sodium: 140 mmol/L (ref 135–145)
Total Bilirubin: 0.6 mg/dL (ref 0.3–1.2)
Total Protein: 7.9 g/dL (ref 6.5–8.1)

## 2023-03-26 LAB — URINE DRUG SCREEN, QUALITATIVE (ARMC ONLY)
Amphetamines, Ur Screen: NOT DETECTED
Barbiturates, Ur Screen: NOT DETECTED
Benzodiazepine, Ur Scrn: POSITIVE — AB
Cannabinoid 50 Ng, Ur ~~LOC~~: NOT DETECTED
Cocaine Metabolite,Ur ~~LOC~~: POSITIVE — AB
MDMA (Ecstasy)Ur Screen: NOT DETECTED
Methadone Scn, Ur: NOT DETECTED
Opiate, Ur Screen: NOT DETECTED
Phencyclidine (PCP) Ur S: NOT DETECTED
Tricyclic, Ur Screen: POSITIVE — AB

## 2023-03-26 LAB — ETHANOL: Alcohol, Ethyl (B): <10 mg/dL (ref <10)

## 2023-03-26 LAB — LACTIC ACID, PLASMA: Lactic Acid, Venous: 1.5 mmol/L (ref 0.5–1.9)

## 2023-03-26 MED ORDER — LORAZEPAM 2 MG/ML IJ SOLN
2.0000 mg | Freq: Once | INTRAMUSCULAR | Status: AC
  Administered 2023-03-26: 2 mg via INTRAVENOUS
  Filled 2023-03-26: qty 1

## 2023-03-26 MED ORDER — SODIUM CHLORIDE 0.9 % IV SOLN: 200.0000 mg | Freq: Two times a day (BID) | INTRAVENOUS | Status: DC

## 2023-03-26 MED ORDER — ONDANSETRON HCL 4 MG PO TABS: 4.0000 mg | ORAL_TABLET | Freq: Four times a day (QID) | ORAL | Status: DC | PRN

## 2023-03-26 MED ORDER — GADOBUTROL 1 MMOL/ML IV SOLN
9.0000 mL | Freq: Once | INTRAVENOUS | Status: AC | PRN
  Administered 2023-03-26: 10 mL via INTRAVENOUS

## 2023-03-26 MED ORDER — IOHEXOL 300 MG/ML  SOLN
100.0000 mL | Freq: Once | INTRAMUSCULAR | Status: AC | PRN
  Administered 2023-03-26: 100 mL via INTRAVENOUS

## 2023-03-26 MED ORDER — LACTATED RINGERS IV SOLN: INTRAVENOUS | Status: DC

## 2023-03-26 MED ORDER — LEVETIRACETAM 500 MG/5ML IV SOLN: 4500.0000 mg | Freq: Once | INTRAVENOUS | Status: DC

## 2023-03-26 MED ORDER — DEXAMETHASONE SODIUM PHOSPHATE 10 MG/ML IJ SOLN
10.0000 mg | Freq: Once | INTRAMUSCULAR | Status: AC
Start: 2023-03-26 — End: 2023-03-26
  Administered 2023-03-26: 10 mg via INTRAVENOUS
  Filled 2023-03-26: qty 1

## 2023-03-26 MED ORDER — LEVETIRACETAM IN NACL 1500 MG/100ML IV SOLN
1500.0000 mg | Freq: Once | INTRAVENOUS | Status: AC
  Administered 2023-03-26: 1500 mg via INTRAVENOUS
  Filled 2023-03-26: qty 100

## 2023-03-26 MED ORDER — SODIUM CHLORIDE 0.9 % IV SOLN
200.0000 mg | Freq: Two times a day (BID) | INTRAVENOUS | Status: DC
  Administered 2023-03-27 – 2023-04-04 (×19): 200 mg via INTRAVENOUS
  Filled 2023-03-26 (×21): qty 20

## 2023-03-26 MED ORDER — ONDANSETRON HCL 4 MG/2ML IJ SOLN: 4.0000 mg | Freq: Four times a day (QID) | INTRAMUSCULAR | Status: DC | PRN

## 2023-03-26 MED ORDER — ACETAMINOPHEN 325 MG PO TABS: 650.0000 mg | ORAL_TABLET | Freq: Four times a day (QID) | ORAL | Status: DC | PRN

## 2023-03-26 MED ORDER — LEVETIRACETAM IN NACL 1500 MG/100ML IV SOLN
1500.0000 mg | Freq: Two times a day (BID) | INTRAVENOUS | Status: DC
  Filled 2023-03-26: qty 100

## 2023-03-26 MED ORDER — LEVETIRACETAM IN NACL 1500 MG/100ML IV SOLN
1500.0000 mg | Freq: Two times a day (BID) | INTRAVENOUS | Status: DC
  Administered 2023-03-26 – 2023-04-04 (×19): 1500 mg via INTRAVENOUS
  Filled 2023-03-26 (×21): qty 100

## 2023-03-26 MED ORDER — SODIUM CHLORIDE 0.9 % IV SOLN
400.0000 mg | Freq: Once | INTRAVENOUS | Status: AC
  Administered 2023-03-26: 400 mg via INTRAVENOUS
  Filled 2023-03-26: qty 40

## 2023-03-26 MED ORDER — ACETAMINOPHEN 650 MG RE SUPP: 650.0000 mg | Freq: Four times a day (QID) | RECTAL | Status: DC | PRN

## 2023-03-26 MED ORDER — DEXAMETHASONE SODIUM PHOSPHATE 4 MG/ML IJ SOLN
4.0000 mg | Freq: Four times a day (QID) | INTRAMUSCULAR | Status: DC
  Administered 2023-03-26 – 2023-03-31 (×18): 4 mg via INTRAVENOUS
  Filled 2023-03-26 (×18): qty 1

## 2023-03-26 NOTE — Consult Note (Signed)
NEUROLOGY CONSULTATION NOTE   Date of service: March 26, 2023 Patient Name: Randy Ray MRN:  [id number] DOB:  [birth year] Reason for consult: focal status epilepticus Requesting physician: Dr. Valora Piccolo _ _ _   _ __   _ __ _ _  __ __   _ __   __ _  History of Present Illness   This is a 67 yo man with hx COPD and tobacco abuse who presents with AMS and rhythmic jerking movements of LUE. He was seen in ED 2 days ago for numbness in his LUE and discharged home. Wife reported patient began to have jerking of LUE and twitching of L face this AM and it has persisted. No hx seizures. No other focal deficits but patient is confused. Head CT was concerning for hemorrhagic metastases. Patient had no known prior hx cancer.  MRI brain wwo Multiple intracranial contrast-enhancing lesions (5 lesions), compatible with metastatic disease. The largest lesion is in the left parietal lobe measuring up to 2.1 cm. Dural-based lesion along the posterior right frontal convexity demonstrates layering blood products.  CNS imaging personally reviewed; I agree with above interpretation  CT chest confirmed primary lung cancer (new dx). Patient has received 4mg  IV ativan and 4.5g keppra and LUE jerking persists. Dr. Cheri Fowler told him about the brain mets but when I went to see him after he could not remember the conversation.   ROS   Per HPI: all other systems reviewed and are negative  Past History   I have reviewed the following:  Past Medical History:  Diagnosis Date   COPD (chronic obstructive pulmonary disease) ([place])    COVID-19    History reviewed. No pertinent surgical history. Family History  Problem Relation Age of Onset   Cancer Mother    Lung cancer Father    Social History   Socioeconomic History   Marital status: Single    Spouse name: Not on file   Number of children: Not on file   Years of education: Not on file   Highest education level: Not on file  Occupational History    Not on file  Tobacco Use   Smoking status: Former    Packs/day: 2.00    Years: 50.00    Additional pack years: 0.00    Total pack years: 100.00    Types: Cigarettes    Quit date: 08/2020    Years since quitting: 2.5   Smokeless tobacco: Never  Vaping Use   Vaping Use: Never used  Substance and Sexual Activity   Alcohol use: Not Currently   Drug use: Not Currently   Sexual activity: Not Currently  Other Topics Concern   Not on file  Social History Narrative   Not on file   Social Determinants of Health   Financial Resource Strain: Not on file  Food Insecurity: Not on file  Transportation Needs: Not on file  Physical Activity: Not on file  Stress: Not on file  Social Connections: Not on file   Allergies  Allergen Reactions   Bee Pollen Shortness Of Breath    Medications   (Not in a hospital admission)     Current Facility-Administered Medications:    [COMPLETED] lacosamide (VIMPAT) 400 mg in sodium chloride 0.9 % 25 mL IVPB, 400 mg, Intravenous, Once, Stopped at 03/26/23 1747 **FOLLOWED BY** [START ON 03/27/2023] lacosamide (VIMPAT) 200 mg in sodium chloride 0.9 % 25 mL IVPB, 200 mg, Intravenous, Q12H, Derek Jack, MD   levETIRAcetam (KEPPRA) IVPB 1500  mg/ 100 mL premix, 1,500 mg, Intravenous, Q12H, Derek Jack, MD  Current Outpatient Medications:    albuterol (VENTOLIN HFA) 108 (90 Base) MCG/ACT inhaler, Inhale 2 puffs into the lungs every 6 (six) hours as needed for wheezing or shortness of breath. (Patient not taking: Reported on 02/19/2022), Disp: 8 g, Rfl: 6   ascorbic acid (VITAMIN C) 500 MG tablet, Take 1 tablet (500 mg total) by mouth daily. (Patient not taking: Reported on 10/27/2020), Disp: 30 tablet, Rfl: 0   feeding supplement, ENSURE ENLIVE, (ENSURE ENLIVE) LIQD, Take 237 mLs by mouth 3 (three) times daily between meals. (Patient not taking: Reported on 02/19/2022), Disp: 21330 mL, Rfl: 0   fluticasone (FLONASE) 50 MCG/ACT nasal spray, Place 2 sprays  into both nostrils daily. (Patient not taking: Reported on 10/27/2020), Disp: 16 g, Rfl: 0   loratadine (CLARITIN) 10 MG tablet, Take 1 tablet (10 mg total) by mouth daily. (Patient not taking: Reported on 10/27/2020), Disp: 30 tablet, Rfl: 0   Multiple Vitamin (MULTIVITAMIN WITH MINERALS) TABS tablet, Take 1 tablet by mouth daily., Disp: 90 tablet, Rfl: 0   naproxen (NAPROSYN) 500 MG tablet, Take 1 tablet (500 mg total) by mouth 2 (two) times daily with a meal., Disp: 20 tablet, Rfl: 0   umeclidinium-vilanterol (ANORO ELLIPTA) 62.5-25 MCG/INH AEPB, Inhale 1 puff into the lungs daily. (Patient not taking: Reported on 02/19/2022), Disp: 30 each, Rfl: 11   zinc sulfate 220 (50 Zn) MG capsule, Take 1 capsule (220 mg total) by mouth daily. (Patient not taking: Reported on 02/19/2022), Disp: 30 capsule, Rfl: 0  Vitals   Vitals:   03/26/23 1800 03/26/23 1900 03/26/23 1911 03/26/23 2005  BP: (!) 146/84 (!) 156/113 (!) 156/113 (!) 142/100  Pulse: 88 90 89 86  Resp: 20 (!) 27 (!) 29 14  Temp: 97.9 F (36.6 C)  98 F (36.7 C) 98.2 F (36.8 C)  TempSrc: Oral   Oral  SpO2: 94% 96% 96% 96%  Weight:      Height:         Body mass index is 31.77 kg/m.  Physical Exam   Physical Exam Gen: alert, oriented to self only, follows some simple commands Resp: CTAB CV: RRR  Neuro: *MS: alert, oriented to self only, follows some simple commands *Speech: dysarthric, does not name or repeat *CN: PERRL, blinks to threat bilat, EOMI, L NLF flattening, hearing intact to voice *Motor: anti-gravity in all extremities except unable to control LUE, rhythmic jerking of LUE throughout exam *Sensory: SILT *Reflexes: 2+ more brisk on L *Coordination, gait: UTA  Labs   CBC:  Recent Labs  Lab 03/26/23 1016  WBC 5.9  NEUTROABS 4.0  HGB 12.0*  HCT 37.6*  MCV 87.9  PLT [id number]    Basic Metabolic Panel:  Lab Results  Component Value Date   NA 140 03/26/2023   K 4.1 03/26/2023   CO2 24 03/26/2023   GLUCOSE  104 (H) 03/26/2023   BUN 14 03/26/2023   CREATININE 1.00 03/26/2023   CALCIUM 9.6 03/26/2023   GFRNONAA >60 03/26/2023   GFRAA >60 09/12/2020   Lipid Panel: No results found for: "LDLCALC" HgbA1c: No results found for: "HGBA1C" Urine Drug Screen:     Component Value Date/Time   LABOPIA NONE DETECTED 03/26/2023 1703   COCAINSCRNUR POSITIVE (A) 03/26/2023 1703   LABBENZ POSITIVE (A) 03/26/2023 1703   AMPHETMU NONE DETECTED 03/26/2023 1703   THCU NONE DETECTED 03/26/2023 1703   LABBARB NONE DETECTED 03/26/2023 1703  Alcohol Level     Component Value Date/Time   ETH <10 03/26/2023 1016    MRI brain wwo Multiple intracranial contrast-enhancing lesions (5 lesions), compatible with metastatic disease. The largest lesion is in the left parietal lobe measuring up to 2.1 cm. Dural-based lesion along the posterior right frontal convexity demonstrates layering blood products.  Impression   Neurology consulted on this 67 yo man with hx tobacco abuse who presented with AMS and LUE focal status. Seizures persist after 4.5g keppra load and 4mg  IV ativan. I have ordered 400mg  vimpat load. His vitals are stable and given his seizures are focal unless he becomes unstable would not be aggressive in terms of intubation and sedation for seizure control at this point. 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 This is a new diagnosis.  Recommendations   - Transfer to [facility] hospitalist service for cEEG - Continue keppra 1500mg  q 12 hrs - Continue vimpat 200mg  q 12 hrs - Call neurology upon patient's arrival to [facility] - He will need consultations from onc and palliative care at [facility] ______________________________________________________________________   Thank you for the opportunity to take part in the care of this patient. If you have any further questions, please contact the neurology consultation attending.  Signed,  Su Monks, MD [facility] [phone]  If 7pm- 7am, please page neurology on call as listed in [place].  **Any copied and pasted documentation in this note was written by me in another application not billed for and pasted by me into this document.

## 2023-03-26 NOTE — ED Notes (Signed)
Arm twitching stopped after 2nd dose of Ativan. Facial twitching still noted. Cheri Fowler, MD aware.

## 2023-03-26 NOTE — ED Notes (Signed)
Gave report to [place] at [place] at this time.

## 2023-03-26 NOTE — ED Provider Notes (Signed)
[facility] Provider Note   Event Date/Time   First MD Initiated Contact with Patient 03/26/23 1006     (approximate) History  Tremors  HPI Randy Ray is a 67 y.o. male with stated past medical history of hypertension who presents with left-sided shaking activity in the left upper extremity and left face.  Patient states that this began this morning and has no previous history of any such episodes.  Patient states that he was seen 2 days prior with numbness to this left upper extremity and received a shoulder x-ray that did not show any evidence of acute abnormalities.  Patient states that the numbness has improved prior to this recurrent shaking activity beginning.  Patient denies any known history of strokes or intracerebral abnormalities.  Patient denies any family history of epilepsy and has never been on any antiepileptic medications.  Patient denies any recent travel, sick contacts, or change to medications.  Patient denies any supplements or over-the-counter/off the Internet to medications at this time. ROS: Patient currently denies any vision changes, tinnitus, difficulty speaking, facial droop, sore throat, chest pain, shortness of breath, abdominal pain, nausea/vomiting/diarrhea, dysuria, or weakness/numbness/paresthesias in any extremity   Physical Exam  Triage Vital Signs: ED Triage Vitals  Enc Vitals Group     BP      Pulse      Resp      Temp      Temp src      SpO2      Weight      Height      Head Circumference      Peak Flow      Pain Score      Pain Loc      Pain Edu?      Excl. in [place]?    Most recent vital signs: Vitals:   03/26/23 1417 03/26/23 1520  BP:  (!) 155/97  Pulse:  75  Resp:  (!) 22  Temp: 97.6 F (36.4 C) 97.6 F (36.4 C)  SpO2:  95%   General: Awake, oriented x4. CV:  Good peripheral perfusion.  Resp:  Normal effort.  Abd:  No distention.  Other:  Repetitive muscular contractions over the left upper extremity,  left anterior neck, and left face ED Results / Procedures / Treatments  Labs (all labs ordered are listed, but only abnormal results are displayed) Labs Reviewed  CBC WITH DIFFERENTIAL/PLATELET - Abnormal; Notable for the following components:      Result Value   Hemoglobin 12.0 (*)    HCT 37.6 (*)    All other components within normal limits  COMPREHENSIVE METABOLIC PANEL - Abnormal; Notable for the following components:   Glucose, Bld 104 (*)    All other components within normal limits  LACTIC ACID, PLASMA  ETHANOL  URINE DRUG SCREEN, QUALITATIVE ([facility] ONLY)  URINALYSIS, W/ REFLEX TO CULTURE (INFECTION SUSPECTED)   RADIOLOGY ED MD interpretation: CT of the chest/abdomen/pelvis independently interpreted by me and shows a lingular primary bronchogenic carcinoma with subcarinal and's left suprahilar nodal metastasis as well as isolated osseous metastasis within the left scapula.  There is no subdiaphragmatic soft tissue metastasis identified  MRI of the brain with and without contrast interpreted independently by me shows multiple intracranial contrast-enhancing lesions compatible with metastatic disease with the largest lesion being in the left parietal lobe measuring 2.1 cm as well as a dural based lesion along the posterior right frontal convexity demonstrating layering blood products  CT without contrast  of the head interpreted independently by me shows 2 hemorrhagic lesions in the high posterior right frontal lobe in the left frontoparietal region with surrounding vasogenic edema concerning for metastatic disease -Agree with radiology assessment Official radiology report(s): CT CHEST ABDOMEN PELVIS W CONTRAST  Result Date: 03/26/2023 CLINICAL DATA:  Brain metastasis. Evaluate for primary malignancy. * Tracking Code: BO * EXAM: CT CHEST, ABDOMEN, AND PELVIS WITH CONTRAST TECHNIQUE: Multidetector CT imaging of the chest, abdomen and pelvis was performed following the standard protocol  during bolus administration of intravenous contrast. RADIATION DOSE REDUCTION: This exam was performed according to the departmental dose-optimization program which includes automated exposure control, adjustment of the mA and/or kV according to patient size and/or use of iterative reconstruction technique. CONTRAST:  165mL OMNIPAQUE IOHEXOL 300 MG/ML  SOLN COMPARISON:  Chest radiograph 02/19/2022.  CTA chest 09/09/2020 FINDINGS: CT CHEST FINDINGS Cardiovascular: Bovine arch. Aortic atherosclerosis. Tortuous thoracic aorta. Mild cardiomegaly, without pericardial effusion. Left main coronary artery calcification. No central pulmonary embolism, on this non-dedicated study. Mediastinum/Nodes: No supraclavicular adenopathy. No axillary adenopathy. Mediastinal adenopathy, with a subcarinal node measuring 2.2 x 4.2 cm on 28/2. AP window/left suprahilar necrotic node measures 1.5 x 2.1 cm on 24/2. Lungs/Pleura: No pleural fluid. Secretions in the right side of the trachea. Advanced bullous emphysema. Mild limitations as patient's arms are not raised above the head. Pleural-based lingular mass with mild surrounding ground-glass measures 5.8 x 3.5 cm on 80/4. 3.4 cm craniocaudal on coronal image 57. Musculoskeletal: Mild right-sided gynecomastia. Lytic lesion within the left side of the inferior scapula including at 2.4 cm on 22/2. CT ABDOMEN PELVIS FINDINGS Hepatobiliary: Focal steatosis adjacent the falciform ligament. Normal gallbladder, without biliary ductal dilatation. Pancreas: Normal, without mass or ductal dilatation. Spleen: Normal in size, without focal abnormality. Adrenals/Urinary Tract: Normal adrenal glands. Bilateral too small to characterize renal lesions . In the absence of clinically indicated signs/symptoms require(s) no independent follow-up. No hydronephrosis. Normal urinary bladder. Stomach/Bowel: Normal stomach, without wall thickening. Scattered colonic diverticula. Normal terminal ileum and  appendix. Composition degrades evaluation of the abdomen as well. Normal small bowel. Vascular/Lymphatic: Aortic atherosclerosis. No abdominopelvic adenopathy. Reproductive: Mild prostatomegaly. Other: No significant free fluid. No evidence of omental or peritoneal disease. Musculoskeletal: Sclerotic tiny foci in both femoral heads are suspicious for minimal avascular necrosis. Transitional L5 vertebral body. IMPRESSION: 1. Mild patient position degradation. 2. 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 3. Isolated osseous metastasis within the left scapula. 4. No subdiaphragmatic soft tissue metastasis identified. 5. 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 6. Aortic atherosclerosis (ICD10-I70.0) and emphysema (ICD10-J43.9). Electronically Signed   By: Abigail Miyamoto M.D.   On: 03/26/2023 15:13   MR Brain W and Wo Contrast  Result Date: 03/26/2023 CLINICAL DATA:  Seizure EXAM: MRI HEAD WITHOUT AND WITH CONTRAST TECHNIQUE: Multiplanar, multiecho pulse sequences of the brain and surrounding structures were obtained without and with intravenous contrast. CONTRAST:  59mL GADAVIST GADOBUTROL 1 MMOL/ML IV SOLN COMPARISON:  Same day CT brain FINDINGS: Brain: Negative for an acute infarct. No hydrocephalus. No extra-axial fluid collection. There are multiple intracranial contrast-enhancing lesions, as below - 1. There is a 1.3 x 0.9 cm contrast-enhancing lesion in the right frontal lobe (series 24, image 108). There is surrounding vasogenic edema. There is susceptibility artifact associated with the lesion, compatible with intralesional blood products. 2. There is a 2.0 x 2.0 x 2.1 cm contrast-enhancing lesion in the left parietal lobe (series 24, image 114). There is surrounding  vasogenic edema. 3. There is a heterogeneous dural-based contrast-enhancing lesion along the posterior right frontal convexity measuring 1.8 x 1.6 x 3.0 cm (series 24, image 147). This  lesion demonstrates layering blood products, compatible with frank hemorrhage. 4. There is a 0.7 x 0.5 cm contrast-enhancing lesion in the left frontal lobe/centrum semiovale (series 44, image 121). There is susceptibility artifact associated with this lesion, compatible with intralesional blood products. 5. There is a 0.5 x 0.5 cm lesion in the left frontal lobe (series 24, image 134) Vascular: Normal flow voids. Skull and upper cervical spine: Normal marrow signal. Sinuses/Orbits: No middle ear or mastoid effusion. Paranasal sinuses are clear. There is a chronic fracture of the left lamina papyracea. Other: None. IMPRESSION: Multiple intracranial contrast-enhancing lesions (5 lesions), compatible with metastatic disease. The largest lesion is in the left parietal lobe measuring up to 2.1 cm. Dural-based lesion along the posterior right frontal convexity demonstrates layering blood products. Electronically Signed   By: Marin Roberts M.D.   On: 03/26/2023 12:29   CT Head Wo Contrast  Result Date: 03/26/2023 CLINICAL DATA:  Seizure.  New onset. EXAM: CT HEAD WITHOUT CONTRAST TECHNIQUE: Contiguous axial images were obtained from the base of the skull through the vertex without intravenous contrast. RADIATION DOSE REDUCTION: This exam was performed according to the departmental dose-optimization program which includes automated exposure control, adjustment of the mA and/or kV according to patient size and/or use of iterative reconstruction technique. COMPARISON:  None Available. FINDINGS: Brain: There is a 2.1 x 1.7 x 2.2 cm lesion in the high posterior right frontal lobe (series 3, image 28) with surrounding vasogenic edema and intralesional blood products. There is an additional 2.0 x 1.7 x 1.9 cm lesion in the left frontoparietal region which likely also has a small amount of intralesional blood products. There is an additional area of which edema in the inferior right frontal lobe (series 3, image 12), which  likely represents an additional site of intracranial disease. No hydrocephalus. No extra-axial fluid collection. Vascular: No hyperdense vessel or unexpected calcification. Skull: Normal. Negative for fracture or focal lesion. Sinuses/Orbits: No middle ear or mastoid effusion. Paranasal sinuses are notable for mild mucosal thickening in the right maxillary sinus. Orbits are unremarkable. Other: None. IMPRESSION: Two hemorrhagic lesions in the high posterior right frontal lobe and left frontoparietal region with surrounding vasogenic edema concerning for metastatic disease. Additional area of vasogenic edema in the inferior right frontal lobe likely represents an additional site of intracranial disease. Recommend contrast enhanced brain MRI for further evaluation. Given patient's history of COPD, lung primary is a potential consideration. Electronically Signed   By: Marin Roberts M.D.   On: 03/26/2023 11:36   PROCEDURES: Critical Care performed: Yes, see critical care procedure note(s) .1-3 Lead EKG Interpretation  Performed by: Naaman Plummer, MD Authorized by: Naaman Plummer, MD     Interpretation: normal     ECG rate:  71   ECG rate assessment: normal     Rhythm: sinus rhythm     Ectopy: none     Conduction: normal   CRITICAL CARE Performed by: Naaman Plummer  Total critical care time: 47 minutes  Critical care time was exclusive of separately billable procedures and treating other patients.  Critical care was necessary to treat or prevent imminent or life-threatening deterioration.  Critical care was time spent personally by me on the following activities: development of treatment plan with patient and/or surrogate as well as nursing, discussions with consultants, evaluation  of patient's response to treatment, examination of patient, obtaining history from patient or surrogate, ordering and performing treatments and interventions, ordering and review of laboratory studies, ordering and  review of radiographic studies, pulse oximetry and re-evaluation of patient's condition.  MEDICATIONS ORDERED IN ED: Medications  lacosamide (VIMPAT) 400 mg in sodium chloride 0.9 % 25 mL IVPB (has no administration in time range)    Followed by  lacosamide (VIMPAT) 200 mg in sodium chloride 0.9 % 25 mL IVPB (has no administration in time range)  levETIRAcetam (KEPPRA) IVPB 1500 mg/ 100 mL premix (has no administration in time range)  LORazepam (ATIVAN) injection 2 mg (2 mg Intravenous Given 03/26/23 1013)  LORazepam (ATIVAN) injection 2 mg (2 mg Intravenous Given 03/26/23 1032)  levETIRAcetam (KEPPRA) IVPB 1500 mg/ 100 mL premix (0 mg Intravenous Stopped 03/26/23 1109)    Followed by  levETIRAcetam (KEPPRA) IVPB 1500 mg/ 100 mL premix (0 mg Intravenous Stopped 03/26/23 1228)    Followed by  levETIRAcetam (KEPPRA) IVPB 1500 mg/ 100 mL premix (0 mg Intravenous Stopped 03/26/23 1244)  dexamethasone (DECADRON) injection 10 mg (10 mg Intravenous Given 03/26/23 1212)  gadobutrol (GADAVIST) 1 MMOL/ML injection 9 mL (10 mLs Intravenous Contrast Given 03/26/23 1200)  iohexol (OMNIPAQUE) 300 MG/ML solution 100 mL (100 mLs Intravenous Contrast Given 03/26/23 1436)   IMPRESSION / MDM / ASSESSMENT AND PLAN / ED COURSE  I reviewed the triage vital signs and the nursing notes.                             The patient is on the cardiac monitor to evaluate for evidence of arrhythmia and/or significant heart rate changes. Patient's presentation is most consistent with acute presentation with potential threat to life or bodily function. Patient is 67 year old male who presents for left upper extremity repetitive motion concerning for possible seizure activity.   -2 mg of Ativan x 2 as well as 4.5 g Keppra IV with moderate control of patient's symptoms however there is still some mild clonus in the left upper extremity Imaging concerning for likely metastatic disease intracranially, will add 10 mg Decadron IV  MRI  showing likely intracranial metastasis as well CT of the chest/abdomen/pelvis shows likely primary bronchogenic lingular carcinoma  Consults: Nam-pulmonology who states that there is no emergent need for biopsy at this time Abd-el-Barr-neurosurgery who states that this patient will likely need biopsy of primary mass however there is no indication at this time for intracranial biopsy Stack-neurology recommends transfer to [facility] given patient's persistent altered mental status and seizure-like activity  Dispo: Transfer to [facility]   Clinical Course as of 03/26/23 1606  Sat Mar 26, 2023  1231 Patient given 2 mg Ativan and with Keppra infusing, tremor of the left upper extremity has distinguished. [EB]    Clinical Course User Index [EB] Naaman Plummer, MD   FINAL CLINICAL IMPRESSION(S) / ED DIAGNOSES   Final diagnoses:  Witnessed seizure-like activity ([place])  Intracranial mass  Pulmonary mass   Rx / DC Orders   ED Discharge Orders     None      Note:  This document was prepared using Dragon voice recognition software and may include unintentional dictation errors.   Naaman Plummer, MD 03/26/23 [phone]

## 2023-03-26 NOTE — H&P (Signed)
History and Physical    Patient: Randy Ray [id number] DOB: 1956-12-04 DOA: 03/26/2023 DOS: the patient was seen and examined on 03/26/2023 PCP: Pcp, No  Patient coming from: Home  Chief Complaint: Seizures  HPI: Randy Ray is a 67 y.o. male with medical history significant of COPD, tobacco abuse.  Pt in to [facility] ED with LUE tremors and L sided weakness, AMS.  Pt seen in ED 2 days ago for numbness of LUE and discharged home.  Wife reported patient began to have jerking of LUE and twitching of L face this AM and it has persisted. No hx seizures.   Pt confused in ED.  Head CT revealed hemorrhagic brain mets (new finding / diagnosis).   Review of Systems: As mentioned in the history of present illness. All other systems reviewed and are negative. Past Medical History:  Diagnosis Date   COPD (chronic obstructive pulmonary disease) ([place])    COVID-19    No past surgical history on file. Social History:  reports that he quit smoking about 2 years ago. His smoking use included cigarettes. He has a 100.00 pack-year smoking history. He has never used smokeless tobacco. He reports that he does not currently use alcohol. He reports that he does not currently use drugs.  Allergies  Allergen Reactions   Bee Pollen Shortness Of Breath    Family History  Problem Relation Age of Onset   Cancer Mother    Lung cancer Father     Prior to Admission medications   Medication Sig Start Date End Date Taking? Authorizing Provider  albuterol (VENTOLIN HFA) 108 (90 Base) MCG/ACT inhaler Inhale 2 puffs into the lungs every 6 (six) hours as needed for wheezing or shortness of breath. Patient not taking: Reported on 02/19/2022 10/27/20   Tyler Pita, MD  ascorbic acid (VITAMIN C) 500 MG tablet Take 1 tablet (500 mg total) by mouth daily. Patient not taking: Reported on 10/27/2020 09/14/20   Loletha Grayer, MD  feeding supplement, ENSURE ENLIVE, (ENSURE ENLIVE) LIQD Take 237 mLs by  mouth 3 (three) times daily between meals. Patient not taking: Reported on 02/19/2022 09/13/20   Loletha Grayer, MD  fluticasone [facility]) 50 MCG/ACT nasal spray Place 2 sprays into both nostrils daily. Patient not taking: Reported on 10/27/2020 09/14/20   Loletha Grayer, MD  loratadine (CLARITIN) 10 MG tablet Take 1 tablet (10 mg total) by mouth daily. Patient not taking: Reported on 10/27/2020 09/13/20   Loletha Grayer, MD  Multiple Vitamin (MULTIVITAMIN WITH MINERALS) TABS tablet Take 1 tablet by mouth daily. 02/20/22   Lorella Nimrod, MD  naproxen (NAPROSYN) 500 MG tablet Take 1 tablet (500 mg total) by mouth 2 (two) times daily with a meal. 03/24/23   Madalyn Rob, Wendi Maya, PA-C  umeclidinium-vilanterol (ANORO ELLIPTA) 62.5-25 MCG/INH AEPB Inhale 1 puff into the lungs daily. Patient not taking: Reported on 02/19/2022 10/27/20   Tyler Pita, MD  zinc sulfate 220 (50 Zn) MG capsule Take 1 capsule (220 mg total) by mouth daily. Patient not taking: Reported on 02/19/2022 09/14/20   Loletha Grayer, MD    Physical Exam: There were no vitals filed for this visit. Constitutional: NAD, calm, comfortable Respiratory: clear to auscultation bilaterally, no wheezing, no crackles. Normal respiratory effort. No accessory muscle use.  Cardiovascular: Regular rate and rhythm, no murmurs / rubs / gallops. No extremity edema. 2+ pedal pulses. No carotid bruits.  Abdomen: no tenderness, no masses palpated. No hepatosplenomegaly. Bowel sounds positive.  Neurologic: obeys commands on  R side, having intermittent tremors of LUE, weakness of LUE and LLE.  Slurred speech. Psychiatric: Awake, alert, responding to questions, maybe some confusion?  Data Reviewed:       Latest Ref Rng & Units 03/26/2023   10:16 AM 02/19/2022    6:43 PM 02/19/2022    3:25 PM  CBC  WBC 4.0 - 10.5 K/uL 5.9  4.4  4.8   Hemoglobin 13.0 - 17.0 g/dL 12.0  12.7  12.6   Hematocrit 39.0 - 52.0 % 37.6  39.9  38.9   Platelets 150 - 400  K/uL 303  200  209       Latest Ref Rng & Units 03/26/2023   10:16 AM 02/20/2022    6:25 AM 02/19/2022    6:43 PM  CMP  Glucose 70 - 99 mg/dL 104  91    BUN 8 - 23 mg/dL 14  20    Creatinine 0.61 - 1.24 mg/dL 1.00  0.97  1.22   Sodium 135 - 145 mmol/L 140  141    Potassium 3.5 - 5.1 mmol/L 4.1  4.4    Chloride 98 - 111 mmol/L 107  110    CO2 22 - 32 mmol/L 24  21    Calcium 8.9 - 10.3 mg/dL 9.6  8.7    Total Protein 6.5 - 8.1 g/dL 7.9  7.1    Total Bilirubin 0.3 - 1.2 mg/dL 0.6  1.0    Alkaline Phos 38 - 126 U/L 57  49    AST 15 - 41 U/L 17  23    ALT 0 - 44 U/L 10  18    Drugs of Abuse     Component Value Date/Time   LABOPIA NONE DETECTED 03/26/2023 1703   COCAINSCRNUR POSITIVE (A) 03/26/2023 1703   LABBENZ POSITIVE (A) 03/26/2023 1703   AMPHETMU NONE DETECTED 03/26/2023 1703   THCU NONE DETECTED 03/26/2023 1703   LABBARB NONE DETECTED 03/26/2023 1703    Urinalysis    Component Value Date/Time   COLORURINE YELLOW (A) 03/26/2023 1703   APPEARANCEUR HAZY (A) 03/26/2023 1703   LABSPEC >1.046 (H) 03/26/2023 1703   PHURINE 5.0 03/26/2023 1703   GLUCOSEU NEGATIVE 03/26/2023 1703   HGBUR NEGATIVE 03/26/2023 1703   BILIRUBINUR NEGATIVE 03/26/2023 1703   KETONESUR NEGATIVE 03/26/2023 1703   PROTEINUR NEGATIVE 03/26/2023 1703   NITRITE POSITIVE (A) 03/26/2023 1703   LEUKOCYTESUR LARGE (A) 03/26/2023 1703    MRI brain: IMPRESSION: Multiple intracranial contrast-enhancing lesions (5 lesions), compatible with metastatic disease. The largest lesion is in the left parietal lobe measuring up to 2.1 cm. Dural-based lesion along the posterior right frontal convexity demonstrates layering blood products.  CT chest abd pelvis: IMPRESSION: 1. Mild patient position degradation. 2. 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 3. Isolated osseous metastasis within the left scapula. 4. No subdiaphragmatic soft tissue metastasis  identified. 5. 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 6. Aortic atherosclerosis (ICD10-I70.0) and emphysema (ICD10-J43.9).  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  Awake, talking, having intermittent tremors / seizures on L side of body. Almost certainly this is secondary to his hemorrhagic brain mets discovered on imaging today. 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  Decadron given the vasogenic edema in brain IR consult tomorrow for biopsy  Pyuria No fever, no WBC UCx pending Will await culture results before ordering ABx  Cocaine abuse ([place]) UDS positive for cocaine. Likely not helping the seizures, but again, think the primary driver of todays seizures are the hemorrhagic brain mets.      Advance Care Planning:   Code Status: Full Code  Consults: Neurology, d/w Dr. Lorrin Goodell, see Dr. Artemio Aly note  Family Communication: No family in room  Severity of Illness: The appropriate patient status for this patient is INPATIENT. Inpatient status is judged to be reasonable and necessary in order to provide the required intensity of service to ensure the patient's safety. The patient's presenting symptoms, physical exam findings, and initial radiographic and laboratory data in the context of their chronic comorbidities is felt to place them at high risk for further clinical deterioration. Furthermore, it is not anticipated that the patient will be medically stable for discharge from the hospital within 2 midnights of admission.   * I certify that at the point of admission it is my clinical judgment that the patient will require inpatient hospital care spanning beyond 2 midnights  from the point of admission due to high intensity of service, high risk for further deterioration and high frequency of surveillance required.*  Author: Etta Quill., DO 03/26/2023 11:21 PM  For on call review [email or web address].

## 2023-03-26 NOTE — ED Notes (Signed)
Patient to CT at this time

## 2023-03-26 NOTE — Progress Notes (Addendum)
Plan of Care Note for Accepted transfer   Patient: Randy Ray MRN: [id number]   DOA: (Not on file)  Facility requesting transfer: [facility] Requesting Provider: EDP, Dr. Cheri Fowler Reason for transfer: Possible need for services not available Facility course: Patient presented with tremors and recent numbness of the left upper extremity and tremors of the left upper face.  Imaging revealed multiple contrast-enhancing lesions consistent with metastasis on MRI brain.  Investigation for primary with CT chest abdomen pelvis is ongoing, does have history of significant tobacco use and COPD.  No prior history of neurologic symptoms.  EDP spoke with neurosurgery there who stated patient would need to transfer here for intervention/biopsy if CT chest abdomen pelvis was negative and it was needed over the weekend.  I discussed with EDP that we will wait for CT chest abdomen and pelvis results to determine if there is a better site for biopsy and if transfer is truly appropriate/needed.  If there is a site that will need to be biopsied will need to discuss this with appropriate specialist service prior to transfer as well.  EDP agrees to sign out case to oncoming ED provider at [facility] and reconsult as needed based on CT chest abdomen pelvis results  Addendum > EDP spoke with pulmonology after CT scan showed lingular primary and some lymph nodes representing metastatic disease.  I stated there is no urgent need for biopsy at this time.  EDP also spoke with neurology who based on patient's mental status would like patient to be transferred to [facility] for continuous EEG monitoring.  Will be worthwhile to get our pulmonology service involved while he is here but is primarily transferring for this continuous EEG.  Will likely benefit from oncology consultation to help arrange follow-up as well.  Plan of care: The patient is excepted for transfer to medical telemetry bed at [facility].  Author: Marcelyn Bruins, MD 03/26/2023  Check [email or web address] for on-call coverage.  Nursing staff, Please call [place] number on Amion as soon as patient's arrival, so appropriate admitting provider can evaluate the pt.

## 2023-03-26 NOTE — Assessment & Plan Note (Signed)
Pt with partial status epilepticus.  Awake, talking, having intermittent tremors / seizures on L side of body. Almost certainly this is secondary to his hemorrhagic brain mets discovered on imaging today. AEDs ordered per neuro consult cEEG Neurology going to go up on AEDs soon apparently Tele monitor Seizure precautions NPO for the moment

## 2023-03-26 NOTE — ED Notes (Signed)
Verified with Charge Nurse Caryl Pina, RN Vimpat medication 400 mg

## 2023-03-26 NOTE — ED Notes (Signed)
Report given at this time to [place], RN.

## 2023-03-26 NOTE — ED Notes (Signed)
Pt A&O to self and place only disoriented to time and situation. Pt incontinent of urine. Pt put in new gown, cleaned up and new blankets given. Vitals currently stable. Pt has tremors of both hands. Sister at bedside.

## 2023-03-26 NOTE — ED Triage Notes (Signed)
Patient to ED via [facility] from home for tremor in left arm and face that started approx 1 hr PTA. Aox4. Given 2.5 Versed by EMS.  18 R AC by EMS  EMS VS: 173/98 80 cbg  96% RA, 96 HR

## 2023-03-26 NOTE — Assessment & Plan Note (Addendum)
UDS positive for cocaine. Likely not helping the seizures, but again, think the primary driver of todays seizures are the hemorrhagic brain mets.

## 2023-03-26 NOTE — Assessment & Plan Note (Signed)
Urine cultures growing out greater than 100,000 colonies of E. coli with sensitivities pending.  Started on IV Rocephin 3/31.

## 2023-03-26 NOTE — Progress Notes (Signed)
Neurology consulted on this 67 yo man with hx tobacco abuse who presented with AMS and LUE focal status. Seizures persist after 4.5g keppra load and 4mg  IV ativan. I have ordered 400mg  vimpat load. His vitals are stable and given his seizures are focal unless he becomes unstable would not be aggressive in terms of intubation and sedation for seizure control at this point. 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 This is a new diagnosis.  Interim recommendations: - Transfer to [facility] hospitalist service for cEEG - Continue keppra 1500mg  q 12 hrs - Continue vimpat 200mg  q 12 hrs - Call neurology upon patient's arrival to [facility] - He will need consultations from onc and palliative care at [facility]  Full consult note to follow.  Su Monks, MD [facility] [phone]  If 7pm- 7am, please page neurology on call as listed in [place].

## 2023-03-26 NOTE — Progress Notes (Signed)
LTM EEG hooked up and running - no initial skin breakdown - push button tested - [facility] monitoring.  

## 2023-03-26 NOTE — ED Notes (Signed)
Patient to MRI at this time.

## 2023-03-26 NOTE — ED Notes (Signed)
Called [facility] spoke w/kiana for possible transfer

## 2023-03-26 NOTE — Consult Note (Addendum)
I reviewed the CT of this individual. There is a consolidation in the left lingular lobe which abuts the chest wall. Differential remains organizing pneumonia vs. Consolidation vs. Malignant neoplasm.    I do not seen airway expressly leading to this area. If you desire to go after this lesion, I would recommend consulting IR and asking for a TTNA/TTNB. He does have suprahilar and subcarinal lymph nodes which may be amenable to EBUS TBNA lymph node biopsy. It may help with staging and NGS, but it is not an emergent procedure.   If patient remains at [facility], would recommend:  1) IR consultation for TTNA/TTNB for suspected bronchogenic carcinoma 2) Pulm consultation on Monday for EBUS  Arcelia Jew MD Pulmonary & Critical Care Medicine   CCT: 25 minutes

## 2023-03-26 NOTE — Assessment & Plan Note (Signed)
New diagnosis.  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  Oncology to see.  Status post left scapular mass biopsy 4/1 with pathology pending.  Palliative care consulted as well

## 2023-03-27 ENCOUNTER — Encounter (HOSPITAL_COMMUNITY): Payer: Self-pay | Admitting: Internal Medicine

## 2023-03-27 DIAGNOSIS — E663 Overweight: Secondary | ICD-10-CM | POA: Diagnosis present

## 2023-03-27 DIAGNOSIS — J449 Chronic obstructive pulmonary disease, unspecified: Secondary | ICD-10-CM

## 2023-03-27 DIAGNOSIS — C3490 Malignant neoplasm of unspecified part of unspecified bronchus or lung: Secondary | ICD-10-CM | POA: Diagnosis not present

## 2023-03-27 DIAGNOSIS — G9341 Metabolic encephalopathy: Secondary | ICD-10-CM | POA: Diagnosis not present

## 2023-03-27 DIAGNOSIS — G40101 Localization-related (focal) (partial) symptomatic epilepsy and epileptic syndromes with simple partial seizures, not intractable, with status epilepticus: Secondary | ICD-10-CM | POA: Diagnosis not present

## 2023-03-27 LAB — BASIC METABOLIC PANEL
Anion gap: 11 (ref 5–15)
BUN: 11 mg/dL (ref 8–23)
CO2: 22 mmol/L (ref 22–32)
Calcium: 9.7 mg/dL (ref 8.9–10.3)
Chloride: 103 mmol/L (ref 98–111)
Creatinine, Ser: 0.85 mg/dL (ref 0.61–1.24)
GFR, Estimated: >60 mL/min (ref >60)
Glucose, Bld: 123 mg/dL — ABNORMAL HIGH (ref 70–99)
Potassium: 4.2 mmol/L (ref 3.5–5.1)
Sodium: 136 mmol/L (ref 135–145)

## 2023-03-27 LAB — CBC
HCT: 37.5 % — ABNORMAL LOW (ref 39.0–52.0)
Hemoglobin: 12.4 g/dL — ABNORMAL LOW (ref 13.0–17.0)
MCH: 28.1 pg (ref 26.0–34.0)
MCHC: 33.1 g/dL (ref 30.0–36.0)
MCV: 84.8 fL (ref 80.0–100.0)
Platelets: 310 10*3/uL (ref 150–400)
RBC: 4.42 MIL/uL (ref 4.22–5.81)
RDW: 13.6 % (ref 11.5–15.5)
WBC: 3.5 10*3/uL — ABNORMAL LOW (ref 4.0–10.5)
nRBC: 0 % (ref 0.0–0.2)

## 2023-03-27 LAB — HIV ANTIBODY (ROUTINE TESTING W REFLEX): HIV Screen 4th Generation wRfx: NONREACTIVE

## 2023-03-27 MED ORDER — VALPROATE SODIUM 100 MG/ML IV SOLN
900.0000 mg | Freq: Two times a day (BID) | INTRAVENOUS | Status: DC
  Administered 2023-03-28 – 2023-03-29 (×4): 900 mg via INTRAVENOUS
  Filled 2023-03-27 (×6): qty 9

## 2023-03-27 MED ORDER — LORAZEPAM 2 MG/ML IJ SOLN
2.0000 mg | INTRAMUSCULAR | Status: DC | PRN
  Administered 2023-03-27 – 2023-03-28 (×5): 2 mg via INTRAVENOUS
  Filled 2023-03-27 (×6): qty 1

## 2023-03-27 MED ORDER — HALOPERIDOL LACTATE 5 MG/ML IJ SOLN
1.0000 mg | INTRAMUSCULAR | Status: AC
  Administered 2023-03-27: 1 mg via INTRAMUSCULAR
  Filled 2023-03-27: qty 1

## 2023-03-27 MED ORDER — VALPROATE SODIUM 100 MG/ML IV SOLN
1000.0000 mg | Freq: Once | INTRAVENOUS | Status: AC
  Administered 2023-03-27: 1000 mg via INTRAVENOUS
  Filled 2023-03-27: qty 10

## 2023-03-27 NOTE — Progress Notes (Signed)
[facility] Progress Note  Patient: Randy Ray    [id number]  DOA: 03/26/2023    Date of Service: the patient was seen and examined on 03/27/2023  Brief hospital course: Patient is a 67 year old male with past medical history of COPD and tobacco abuse who presented to the emergency room on 3/30 complaining of left upper extremity tremors and left-sided weakness as well as confusion.  He had been seen 2 days prior to emergency room for numbness of left upper extremity and was discharged home.  Since then, he started having jerking of his left upper extremity and twitching of his left side of his face.  Emergency room, CT scan of head revealed hemorrhagic brain metastases (new finding/new diagnosis of cancer) and patient was transferred to [facility] for EEG.  Further workup revealed what appeared to be primary lung cancer.  Other labs noteworthy for urinary tract infection and interjecting positive for cocaine and tricyclic antidepressants.  Patient admitted to the hospitalist service with neurology consultation and started on antiepileptics.  Following admission, EEG done morning of 3/31 noting electrographic status epilepticus arising from right central parietal region.  Confirming status.  Patient seen by interventional radiology with plans for biopsy on 4/1.   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  Appreciate neurology help.  Status post EEG confirming status.  Patient started on Keppra and Vimpat on arrival.  Neurology adding Depakote 3/31.  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  Interventional radiology consulted for biopsy which they will do on 4/1.  Oncology to see.  Acute metabolic encephalopathy Improved from initial admission, but still somewhat ongoing.  Alert enough to eat, but at times little disoriented and wants to take off EEG  leads.  Chronic obstructive pulmonary disease (HCC) Currently not hypoxic, as needed inhalers  UTI (urinary tract infection) Given other issues, best to go ahead and treat presumed urinary tract infection.  Have started IV Rocephin.  Cultures are pending.  Cocaine abuse (HCC) UDS positive for cocaine and TCA.  Will counsel eventually, but suspect source of seizures are from hemorrhagic brain mets more likely than cocaine.  Overweight (BMI 25.0-29.9) Meets criteria with BMI greater than 25       Body mass index is 28.83 kg/m.        Consultants: Interventional radiology Neurology  Procedures: EEG  Antimicrobials: IV Rocephin 3/31-present  Code Status: Full code   Subjective: Patient currently more somnolent after getting Ativan  Objective: Vital signs were reviewed and unremarkable. Vitals:   03/27/23 0803 03/27/23 1306  BP: (!) 149/95 (!) 167/94  Pulse: 85 89  Resp: 20 20  Temp: 97.6 F (36.4 C) 97.6 F (36.4 C)  SpO2: 94% 97%    Intake/Output Summary (Last 24 hours) at 03/27/2023 1636 Last data filed at 03/27/2023 0300 Gross per 24 hour  Intake 392.22 ml  Output --  Net 392.22 ml   Filed Weights   03/27/23 0055  Weight: 90.1 kg   Body mass index is 28.83 kg/m.  Exam:  General: Somnolent, confused HEENT: Normocephalic, atraumatic, EEG leads placed Cardiovascular: Regular rate and rhythm, S1-S2 Respiratory: Clear to auscultation bilaterally Abdomen: Soft nontender, nondistended, positive bowel sounds Musculoskeletal: No clubbing or cyanosis or edema Skin: No skin breaks, tears or lesions Psychiatry: Currently acutely confused Neurology: Limited due to sedation  Data Reviewed: White blood cell count of 3.5 today  Disposition:  Status is:  Inpatient Remains inpatient appropriate because:  -Biopsy -Follow-up with oncology    Anticipated discharge date: 4/2    Family Communication: Will call family DVT Prophylaxis: SCDs Start:  03/26/23 2236    Author: Annita Brod ,MD 03/27/2023 4:36 PM  To reach On-call, see care teams to locate the attending and reach out via [email or web address]. Between 7PM-7AM, please contact night-coverage If you still have difficulty reaching the attending provider, please page the [facility] (Director on Call) for [facility] on amion for assistance.

## 2023-03-27 NOTE — Progress Notes (Signed)
Neurology Progress Note  Brief HPI: 67 yo man with hx COPD and tobacco abuse who presents with AMS and rhythmic jerking movements of LUE. He was seen in ED 2 days ago for numbness in his LUE and discharged home. Wife reported patient began to have jerking of LUE and twitching of L face 3/30 and it has persisted. No hx seizures. No other focal deficits but patient is confused. Head CT was concerning for hemorrhagic metastases. Patient had no known prior hx cancer but CT Chest confirmed primary lung cancer.    MRI brain wwo - Multiple intracranial contrast-enhancing lesions (5 lesions), compatible with metastatic disease. The largest lesion is in the left parietal lobe measuring up to 2.1 cm. Dural-based lesion along the posterior right frontal convexity demonstrates layering blood products.     Subjective: Patient seen in room.   Exam: Vitals:   03/27/23 0055 03/27/23 0803  BP: (!) 144/96 (!) 149/95  Pulse: 96 85  Resp: 18 20  Temp: (!) 97.5 F (36.4 C) 97.6 F (36.4 C)  SpO2:  94%   Gen: In bed, NAD Resp: non-labored breathing, no acute distress Abd: soft, nt  Neuro: Mental Status: *MS: alert, oriented to self only, follows some simple commands *Speech: dysarthric, does not name or repeat  *CN: PERRL, blinks to threat bilat, EOMI, L NLF flattening, hearing intact to voice *Motor: anti-gravity in all extremities except unable to control LUE, rhythmic jerking of LUE throughout exam *Sensory: SILT *Reflexes: 2+ more brisk on L *Coordination, gait: UTA    Pertinent Labs: Urinalysis- large leukocytes, positive nitrates  Imaging Reviewed: MRI brain wwo - Multiple intracranial contrast-enhancing lesions (5 lesions), compatible with metastatic disease. The largest lesion is in the left parietal lobe measuring up to 2.1 cm. Dural-based lesion along the posterior right frontal convexity demonstrates layering blood products.  EEG - 03/26/2023 2240 to 0800 - This study showed  electrographic status epilepticus arising from right centro-parietal region. Additionally there was cortical dysfunction in right centro-parietal region likely due to seizures, underlying structural abnormality.   Impression:  Neurology consulted on this 67 yo man with hx tobacco abuse who presented with AMS and LUE focal status. Seizures persist after 4.5g keppra load and 4mg  IV ativan. I have ordered 400mg  vimpat load. His vitals are stable and given his seizures are focal unless he becomes unstable would not be aggressive in terms of intubation and sedation for seizure control at this point. MRI brain wwo showed extensive hemorrhagic mets; CT chest confirmed pulmonary primary which is also a new diagnosis.    Recommendations: - Consult palliative care - Consult oncology- Dr. Mickeal Skinner will discuss with Tumor Board tomorrow morning and follow for pathology results - Keppra 1500mg  BID, Vimpat 200mg  BID - Add depakote 1000mg  once and then 20mg /kg BID   Patient seen and examined by NP/APP with MD. MD to update note as needed.   Janine Ores, DNP, FNP-BC [facility] Pager: [phone]

## 2023-03-27 NOTE — Hospital Course (Addendum)
Patient is a 67 year old male with past medical history of COPD and tobacco abuse who presented to the ER on 3/30 complaining of left upper extremity tremors and left-sided weakness as well as confusion.  He had been seen 2 days prior to emergency room for numbness of left upper extremity and was discharged home.  Since then, he started having jerking of his left upper extremity and twitching of his left side of his face.  In ER CT scan of head revealed hemorrhagic brain metastases (new finding/new diagnosis of cancer) and patient was transferred to [facility] for EEG.  Further workup revealed what appeared to be primary lung cancer.  Other labs noteworthy for urinary tract infection and UDS positive for cocaine and tricyclic antidepressants.  Patient admitted to the hospitalist service with neurology consultation and started on antiepileptics. Following admission, EEG done morning of 3/31 noting electrographic status epilepticus arising from right central parietal region confirming ongoing epilepsy.  As day progressed, patient became more more agitated despite doses of Ativan.  By that night, transferred to ICU on Precedex drip with critical care following.  Patient underwent ultrasound-guided biopsy of left scapular mass by interventional radiology on 4/1. 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  At this point family decided to transition to complete comfort and consultation with palliative care. Currently anticipating hospital death.

## 2023-03-27 NOTE — Progress Notes (Signed)
Received a call regarding the patient being severely agitated and attempting to get out of bed despite having soft restraints in place on his wrist bilaterally.  Presented at bedside.  The patient is severely agitated and violent.  Cursing at the staff and kicking with his legs.  IV ativan 2 mg given without significant improvement.  Still kicking with his legs.  Bilateral ankle restraints also ordered, but even with restraints the patient is still agitated.  For the patient's own safety, a dose of 1 mg of IM haldol has been ordered to be administered.  We will continue to closely monitor and treat as indicated.  Time: 15 minutes.  Charge note.

## 2023-03-27 NOTE — Procedures (Signed)
Patient Name: Randy Ray  MRN: [id number]  Epilepsy Attending: Lora Havens  Referring Physician/Provider: Donnetta Simpers, MD  Duration: 03/26/2023 2240 to 03/27/2023 2240  Patient history: 67 yo man with hx tobacco abuse who presented with AMS and LUE focal status. EEG to evaluate for seizure.   Level of alertness: Awake, asleep  AEDs during EEG study: LEV, LCM, VPA  Technical aspects: This EEG study was done with scalp electrodes positioned according to the 10-20 International system of electrode placement. Electrical activity was reviewed with band pass filter of 1-70Hz , sensitivity of 7 uV/mm, display speed of 58mm/sec with a 60Hz  notched filter applied as appropriate. EEG data were recorded continuously and digitally stored.  Video monitoring was available and reviewed as appropriate.  Description: No clear posterior dominant rhythm was seen. Sleep was characterized by vertex waves, sleep spindles (12 to 14 Hz), maximal frontocentral region. EEG showed continuous generalized and maximal right centro-parietal region 3 to 6 Hz theta-delta slowing.  Seizure were noted arising from right centro-parietal region. EEG showed sharp waves in right centro-parietal region which appear periodic at 1hz  admixed with 4-5hz  theta slowing. Sharp waves then increases in frequency to 2hz  and 2-3hz  delta slowing. It also involved vertex region. No clinical signs were noted. In between seizures, EEG showed periodic discharges in right centro-parietal region. 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 Patient was loaded with valproic acid at around 1430. Subsequently, status epilepticus resolved. EEG showed lateralized periodic discharges at 1hz  in right hemisphere, maximal right centro-parietal region  Hyperventilation and photic stimulation were not performed.    Of note, parts of study were difficult to interpret due to significant  electrode artifact.   ABNORMALITY - Electrographic status epilepticus, right centro-parietal region - Lateralized periodic discharges, right hemisphere, maximal right centro-parietal region ( LPD) - Continuous slow, generalized and maximal right centro-parietal region  IMPRESSION: This study initially showed electrographic status epilepticus arising from right centro-parietal region. Patient was loaded with valproic acid at around 1430 on 03/27/2023. Subsequently, status epilepticus resolved. EEG was then suggestive of epileptogenicity arising from right hemisphere, maximal right centro-parietal region with increased risk of seizure recurrence. Additionally there was cortical dysfunction in right centro-parietal region likely due to seizures, underlying structural abnormality.   [name] Barbra Sarks

## 2023-03-27 NOTE — Assessment & Plan Note (Signed)
Meets criteria with BMI greater than 25 

## 2023-03-27 NOTE — Assessment & Plan Note (Signed)
Improved from initial admission, but still somewhat ongoing.  Alert enough to eat, but at times little disoriented and wants to take off EEG leads.

## 2023-03-27 NOTE — Assessment & Plan Note (Addendum)
Initially not hypoxic and just on as needed inhalers.  Following additional sedation, placed on 3 L nasal cannula.

## 2023-03-27 NOTE — Progress Notes (Signed)
Patient tore off wires earlier in the day and was re-hooked by day shift technologist. Mittens were placed on patient. Patient got out of one mitten and removed several more wires. Day shift RN voiced the need for wrist restraints and evening tech agreed. Told her that there is no coverage after 8p.m. and that if patient removes wires again, maintenance will wait until Monday morning.

## 2023-03-27 NOTE — Consult Note (Signed)
Chief Complaint: Concern for metastatic disease  Referring Physician(s): Jennette Kettle  Supervising Physician: Ruthann Cancer  Patient Status: [facility] - In-pt  History of Present Illness: Randy Ray is a 67 y.o. male with COPD and longstanding tobacco use.  He presented with AMS and rhythmic jerking movements of LUE.   He was seen in ED 2 days ago for numbness in his LUE and was discharged home.   His wife reported he was jerking his left arm and twitching of of his face.  Head CT was concerning for hemorrhagic metastases.   MRI brain showed multiple intracranial contrast-enhancing lesions compatible with metastatic disease. The largest lesion is in the left parietal lobe measuring up to 2.1 cm. Dural-based lesion along the posterior right frontal convexity demonstrates layering blood products.  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 -Isolated osseous metastasis within the left scapula.   We are asked to perform a biopsy for tissue diagnosis.  He is seen in his room with numerous family members.  Past Medical History:  Diagnosis Date   COPD (chronic obstructive pulmonary disease) ([place])    COVID-19     History reviewed. No pertinent surgical history.  Allergies: Bee pollen  Medications: Prior to Admission medications   Medication Sig Start Date End Date Taking? Authorizing Provider  albuterol (VENTOLIN HFA) 108 (90 Base) MCG/ACT inhaler Inhale 2 puffs into the lungs every 6 (six) hours as needed for wheezing or shortness of breath. Patient not taking: Reported on 02/19/2022 10/27/20   Tyler Pita, MD  ascorbic acid (VITAMIN C) 500 MG tablet Take 1 tablet (500 mg total) by mouth daily. Patient not taking: Reported on 10/27/2020 09/14/20   Loletha Grayer, MD  feeding supplement, ENSURE ENLIVE, (ENSURE ENLIVE) LIQD Take 237 mLs by mouth 3 (three) times daily between meals. Patient not  taking: Reported on 02/19/2022 09/13/20   Loletha Grayer, MD  fluticasone [facility]) 50 MCG/ACT nasal spray Place 2 sprays into both nostrils daily. Patient not taking: Reported on 10/27/2020 09/14/20   Loletha Grayer, MD  loratadine (CLARITIN) 10 MG tablet Take 1 tablet (10 mg total) by mouth daily. Patient not taking: Reported on 10/27/2020 09/13/20   Loletha Grayer, MD  Multiple Vitamin (MULTIVITAMIN WITH MINERALS) TABS tablet Take 1 tablet by mouth daily. 02/20/22   Lorella Nimrod, MD  naproxen (NAPROSYN) 500 MG tablet Take 1 tablet (500 mg total) by mouth 2 (two) times daily with a meal. 03/24/23   Madalyn Rob, Wendi Maya, PA-C  umeclidinium-vilanterol (ANORO ELLIPTA) 62.5-25 MCG/INH AEPB Inhale 1 puff into the lungs daily. Patient not taking: Reported on 02/19/2022 10/27/20   Tyler Pita, MD  zinc sulfate 220 (50 Zn) MG capsule Take 1 capsule (220 mg total) by mouth daily. Patient not taking: Reported on 02/19/2022 09/14/20   Loletha Grayer, MD     Family History  Problem Relation Age of Onset   Cancer Mother    Lung cancer Father     Social History   Socioeconomic History   Marital status: Single    Spouse name: Not on file   Number of children: Not on file   Years of education: Not on file   Highest education level: Not on file  Occupational History   Not on file  Tobacco Use   Smoking status: Former    Packs/day: 2.00    Years: 50.00    Additional pack years: 0.00    Total pack years: 100.00  Types: Cigarettes    Quit date: 08/2020    Years since quitting: 2.5   Smokeless tobacco: Never  Vaping Use   Vaping Use: Never used  Substance and Sexual Activity   Alcohol use: Not Currently   Drug use: Not Currently   Sexual activity: Not Currently  Other Topics Concern   Not on file  Social History Narrative   Not on file   Social Determinants of Health   Financial Resource Strain: Not on file  Food Insecurity: No Food Insecurity (03/27/2023)   Hunger Vital Sign     Worried About Running Out of Food in the Last Year: Never true    Ran Out of Food in the Last Year: Never true  Transportation Needs: No Transportation Needs (03/27/2023)   PRAPARE - Hydrologist (Medical): No    Lack of Transportation (Non-Medical): No  Physical Activity: Not on file  Stress: Not on file  Social Connections: Not on file    Review of Systems  Unable to perform ROS: Mental status change    Vital Signs: BP (!) 149/95 (BP Location: Right Arm)   Pulse 85   Temp 97.6 F (36.4 C) (Oral)   Resp 20   Ht 5' 9.6" (1.768 m)   Wt 198 lb 10.2 oz (90.1 kg)   SpO2 94%   BMI 28.83 kg/m   Physical Exam Vitals reviewed.  Constitutional:      Appearance: Normal appearance.  HENT:     Head: Normocephalic and atraumatic.  Eyes:     Extraocular Movements: Extraocular movements intact.  Cardiovascular:     Rate and Rhythm: Normal rate and regular rhythm.  Pulmonary:     Effort: Pulmonary effort is normal. No respiratory distress.     Breath sounds: Normal breath sounds.  Abdominal:     Palpations: Abdomen is soft.  Musculoskeletal:        General: Normal range of motion.     Cervical back: Normal range of motion.  Skin:    General: Skin is warm and dry.  Neurological:     Mental Status: He is alert.     Imaging: Overnight EEG with video  Result Date: 03/27/2023 Lora Havens, MD     03/27/2023  8:07 AM Patient Name: Randy Ray MRN: [id number] Epilepsy Attending: Lora Havens Referring Physician/Provider: Donnetta Simpers, MD Duration: 03/26/2023 2240 to 0800 Patient history: 67 yo man with hx tobacco abuse who presented with AMS and LUE focal status. EEG to evaluate for seizure. Level of alertness: Awake, asleep AEDs during EEG study: LEV, LCM Technical aspects: This EEG study was done with scalp electrodes positioned according to the 10-20 International system of electrode placement. Electrical activity was reviewed with band pass  filter of 1-70Hz , sensitivity of 7 uV/mm, display speed of 36mm/sec with a 60Hz  notched filter applied as appropriate. EEG data were recorded continuously and digitally stored.  Video monitoring was available and reviewed as appropriate. Description: No clear posterior dominant rhythm was seen. Sleep was characterized by vertex waves, sleep spindles (12 to 14 Hz), maximal frontocentral region. EEG showed continuous generalized and maximal right centro-parietal region 3 to 6 Hz theta-delta slowing. Seizure were noted arising from right centro-parietal region. EEG showed sharp waves in right centro-parietal region which appear periodic at 1hz  admixed with 4-5hz  theta slowing. Sharp waves then increases in frequency to 2hz  and 2-3hz  delta slowing. It also involved vertex region. No clinical signs were noted. In between seizures,  EEG showed periodic discharges in right centro-parietal region. 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 Hyperventilation and photic stimulation were not performed.   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 Additionally there was cortical dysfunction in right centro-parietal region likely due to seizures, underlying structural abnormality. Dr. Luevenia Maxin  was notified. Lora Havens   CT CHEST ABDOMEN PELVIS W CONTRAST  Result Date: 03/26/2023 CLINICAL DATA:  Brain metastasis. Evaluate for primary malignancy. * Tracking Code: BO * EXAM: CT CHEST, ABDOMEN, AND PELVIS WITH CONTRAST TECHNIQUE: Multidetector CT imaging of the chest, abdomen and pelvis was performed following the standard protocol during bolus administration of intravenous contrast. RADIATION DOSE REDUCTION: This exam was performed according  to the departmental dose-optimization program which includes automated exposure control, adjustment of the mA and/or kV according to patient size and/or use of iterative reconstruction technique. CONTRAST:  158mL OMNIPAQUE IOHEXOL 300 MG/ML  SOLN COMPARISON:  Chest radiograph 02/19/2022.  CTA chest 09/09/2020 FINDINGS: CT CHEST FINDINGS Cardiovascular: Bovine arch. Aortic atherosclerosis. Tortuous thoracic aorta. Mild cardiomegaly, without pericardial effusion. Left main coronary artery calcification. No central pulmonary embolism, on this non-dedicated study. Mediastinum/Nodes: No supraclavicular adenopathy. No axillary adenopathy. Mediastinal adenopathy, with a subcarinal node measuring 2.2 x 4.2 cm on 28/2. AP window/left suprahilar necrotic node measures 1.5 x 2.1 cm on 24/2. Lungs/Pleura: No pleural fluid. Secretions in the right side of the trachea. Advanced bullous emphysema. Mild limitations as patient's arms are not raised above the head. Pleural-based lingular mass with mild surrounding ground-glass measures 5.8 x 3.5 cm on 80/4. 3.4 cm craniocaudal on coronal image 57. Musculoskeletal: Mild right-sided gynecomastia. Lytic lesion within the left side of the inferior scapula including at 2.4 cm on 22/2. CT ABDOMEN PELVIS FINDINGS Hepatobiliary: Focal steatosis adjacent the falciform ligament. Normal gallbladder, without biliary ductal dilatation. Pancreas: Normal, without mass or ductal dilatation. Spleen: Normal in size, without focal abnormality. Adrenals/Urinary Tract: Normal adrenal glands. Bilateral too small to characterize renal lesions . In the absence of clinically indicated signs/symptoms require(s) no independent follow-up. No hydronephrosis. Normal urinary bladder. Stomach/Bowel: Normal stomach, without wall thickening. Scattered colonic diverticula. Normal terminal ileum and appendix. Composition degrades evaluation of the abdomen as well. Normal small bowel. Vascular/Lymphatic: Aortic  atherosclerosis. No abdominopelvic adenopathy. Reproductive: Mild prostatomegaly. Other: No significant free fluid. No evidence of omental or peritoneal disease. Musculoskeletal: Sclerotic tiny foci in both femoral heads are suspicious for minimal avascular necrosis. Transitional L5 vertebral body. IMPRESSION: 1. Mild patient position degradation. 2. 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 3. Isolated osseous metastasis within the left scapula. 4. No subdiaphragmatic soft tissue metastasis identified. 5. 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 6. Aortic atherosclerosis (ICD10-I70.0) and emphysema (ICD10-J43.9). Electronically Signed   By: Abigail Miyamoto M.D.   On: 03/26/2023 15:13   MR Brain W and Wo Contrast  Result Date: 03/26/2023 CLINICAL DATA:  Seizure EXAM: MRI HEAD WITHOUT AND WITH CONTRAST TECHNIQUE: Multiplanar, multiecho pulse sequences of the brain and surrounding structures were obtained without and with intravenous contrast. CONTRAST:  51mL GADAVIST GADOBUTROL 1 MMOL/ML IV SOLN COMPARISON:  Same day CT brain FINDINGS: Brain: Negative for an acute infarct. No hydrocephalus. No extra-axial fluid collection. There are multiple intracranial contrast-enhancing lesions, as below - 1. There is a 1.3 x 0.9 cm contrast-enhancing lesion in the right  frontal lobe (series 24, image 108). There is surrounding vasogenic edema. There is susceptibility artifact associated with the lesion, compatible with intralesional blood products. 2. There is a 2.0 x 2.0 x 2.1 cm contrast-enhancing lesion in the left parietal lobe (series 24, image 114). There is surrounding vasogenic edema. 3. There is a heterogeneous dural-based contrast-enhancing lesion along the posterior right frontal convexity measuring 1.8 x 1.6 x 3.0 cm (series 24, image 147). This lesion demonstrates layering blood products, compatible with frank hemorrhage. 4. There is a 0.7 x 0.5 cm  contrast-enhancing lesion in the left frontal lobe/centrum semiovale (series 44, image 121). There is susceptibility artifact associated with this lesion, compatible with intralesional blood products. 5. There is a 0.5 x 0.5 cm lesion in the left frontal lobe (series 24, image 134) Vascular: Normal flow voids. Skull and upper cervical spine: Normal marrow signal. Sinuses/Orbits: No middle ear or mastoid effusion. Paranasal sinuses are clear. There is a chronic fracture of the left lamina papyracea. Other: None. IMPRESSION: Multiple intracranial contrast-enhancing lesions (5 lesions), compatible with metastatic disease. The largest lesion is in the left parietal lobe measuring up to 2.1 cm. Dural-based lesion along the posterior right frontal convexity demonstrates layering blood products. Electronically Signed   By: Marin Roberts M.D.   On: 03/26/2023 12:29   CT Head Wo Contrast  Result Date: 03/26/2023 CLINICAL DATA:  Seizure.  New onset. EXAM: CT HEAD WITHOUT CONTRAST TECHNIQUE: Contiguous axial images were obtained from the base of the skull through the vertex without intravenous contrast. RADIATION DOSE REDUCTION: This exam was performed according to the departmental dose-optimization program which includes automated exposure control, adjustment of the mA and/or kV according to patient size and/or use of iterative reconstruction technique. COMPARISON:  None Available. FINDINGS: Brain: There is a 2.1 x 1.7 x 2.2 cm lesion in the high posterior right frontal lobe (series 3, image 28) with surrounding vasogenic edema and intralesional blood products. There is an additional 2.0 x 1.7 x 1.9 cm lesion in the left frontoparietal region which likely also has a small amount of intralesional blood products. There is an additional area of which edema in the inferior right frontal lobe (series 3, image 12), which likely represents an additional site of intracranial disease. No hydrocephalus. No extra-axial fluid  collection. Vascular: No hyperdense vessel or unexpected calcification. Skull: Normal. Negative for fracture or focal lesion. Sinuses/Orbits: No middle ear or mastoid effusion. Paranasal sinuses are notable for mild mucosal thickening in the right maxillary sinus. Orbits are unremarkable. Other: None. IMPRESSION: Two hemorrhagic lesions in the high posterior right frontal lobe and left frontoparietal region with surrounding vasogenic edema concerning for metastatic disease. Additional area of vasogenic edema in the inferior right frontal lobe likely represents an additional site of intracranial disease. Recommend contrast enhanced brain MRI for further evaluation. Given patient's history of COPD, lung primary is a potential consideration. Electronically Signed   By: Marin Roberts M.D.   On: 03/26/2023 11:36   DG Shoulder Left  Result Date: 03/24/2023 CLINICAL DATA:  Pain, numbness EXAM: LEFT SHOULDER - 3 VIEW COMPARISON:  No prior shoulder radiograph available FINDINGS: There is no evidence of fracture or dislocation. There is no evidence of arthropathy or other focal bone abnormality. Soft tissues are unremarkable. IMPRESSION: Negative. Electronically Signed   By: Merilyn Baba M.D.   On: 03/24/2023 10:55    Labs:  CBC: Recent Labs    03/26/23 1016 03/27/23 0824  WBC 5.9 3.5*  HGB 12.0* 12.4*  HCT  37.6* 37.5*  PLT 303 310    COAGS: No results for input(s): "INR", "APTT" in the last 8760 hours.  BMP: Recent Labs    03/26/23 1016 03/27/23 0824  NA 140 136  K 4.1 4.2  CL 107 103  CO2 24 22  GLUCOSE 104* 123*  BUN 14 11  CALCIUM 9.6 9.7  CREATININE 1.00 0.85  GFRNONAA >60 >60    LIVER FUNCTION TESTS: Recent Labs    03/26/23 1016  BILITOT 0.6  AST 17  ALT 10  ALKPHOS 57  PROT 7.9  ALBUMIN 3.8    TUMOR MARKERS: No results for input(s): "AFPTM", "CEA", "CA199", "CHROMGRNA" in the last 8760 hours.  Assessment and Plan:  Concern for metastatic disease.  Images  reviewed by Dr.Yamagata.   Will plan for image guided biopsy of the left scapular lesion tomorrow by Dr. Serafina Royals.  Risks and benefits of scapular biopsy was discussed with the patient and/or patient's family including, but not limited to bleeding, infection, damage to adjacent structures or low yield requiring additional tests.  All of the questions were answered and there is agreement to proceed.  Consent signed and in IR.  Thank you for allowing our service to participate in [facility] 's care.  Electronically Signed: Murrell Redden, PA-C   03/27/2023, 12:05 PM      I spent a total of 40 Minutes  in face to face in clinical consultation, greater than 50% of which was counseling/coordinating care for scapular biopsy.

## 2023-03-28 ENCOUNTER — Inpatient Hospital Stay (HOSPITAL_COMMUNITY): Payer: Medicare Other

## 2023-03-28 ENCOUNTER — Other Ambulatory Visit: Payer: Self-pay | Admitting: Radiation Therapy

## 2023-03-28 DIAGNOSIS — C7931 Secondary malignant neoplasm of brain: Secondary | ICD-10-CM

## 2023-03-28 DIAGNOSIS — C3490 Malignant neoplasm of unspecified part of unspecified bronchus or lung: Secondary | ICD-10-CM | POA: Diagnosis not present

## 2023-03-28 DIAGNOSIS — Z7189 Other specified counseling: Secondary | ICD-10-CM

## 2023-03-28 DIAGNOSIS — Z515 Encounter for palliative care: Secondary | ICD-10-CM | POA: Diagnosis not present

## 2023-03-28 DIAGNOSIS — E663 Overweight: Secondary | ICD-10-CM

## 2023-03-28 DIAGNOSIS — G40101 Localization-related (focal) (partial) symptomatic epilepsy and epileptic syndromes with simple partial seizures, not intractable, with status epilepticus: Secondary | ICD-10-CM | POA: Diagnosis not present

## 2023-03-28 DIAGNOSIS — G9341 Metabolic encephalopathy: Secondary | ICD-10-CM | POA: Diagnosis not present

## 2023-03-28 DIAGNOSIS — J449 Chronic obstructive pulmonary disease, unspecified: Secondary | ICD-10-CM | POA: Diagnosis not present

## 2023-03-28 LAB — PROTIME-INR
INR: 1.1 (ref 0.8–1.2)
Prothrombin Time: 14.3 seconds (ref 11.4–15.2)

## 2023-03-28 LAB — MRSA NEXT GEN BY PCR, NASAL: MRSA by PCR Next Gen: NOT DETECTED

## 2023-03-28 LAB — VALPROIC ACID LEVEL: Valproic Acid Lvl: 62 ug/mL (ref 50.0–100.0)

## 2023-03-28 LAB — AMMONIA: Ammonia: 39 umol/L — ABNORMAL HIGH (ref 9–35)

## 2023-03-28 MED ORDER — SODIUM CHLORIDE 0.9 % IV SOLN
1.0000 g | Freq: Every day | INTRAVENOUS | Status: DC
  Administered 2023-03-28 – 2023-03-31 (×4): 1 g via INTRAVENOUS
  Filled 2023-03-28 (×4): qty 10

## 2023-03-28 MED ORDER — LORAZEPAM 2 MG/ML IJ SOLN
1.0000 mg | INTRAMUSCULAR | Status: AC
  Administered 2023-03-28: 1 mg via INTRAVENOUS

## 2023-03-28 MED ORDER — LORAZEPAM 2 MG/ML IJ SOLN
2.0000 mg | INTRAMUSCULAR | Status: DC | PRN
  Administered 2023-03-29 (×3): 2 mg via INTRAVENOUS
  Filled 2023-03-28 (×6): qty 1

## 2023-03-28 MED ORDER — LIDOCAINE HCL (PF) 1 % IJ SOLN: 9.0000 mL | Freq: Once | INTRAMUSCULAR | Status: DC

## 2023-03-28 MED ORDER — DEXMEDETOMIDINE HCL IN NACL 400 MCG/100ML IV SOLN
0.0000 ug/kg/h | INTRAVENOUS | Status: DC
  Administered 2023-03-28: 0.6 ug/kg/h via INTRAVENOUS
  Administered 2023-03-28: 0.4 ug/kg/h via INTRAVENOUS
  Administered 2023-03-29: 0.08 ug/kg/h via INTRAVENOUS
  Administered 2023-03-29: 0.2 ug/kg/h via INTRAVENOUS
  Administered 2023-03-30: 0.8 ug/kg/h via INTRAVENOUS
  Administered 2023-03-30: 0.4 ug/kg/h via INTRAVENOUS
  Administered 2023-03-31: 0.2 ug/kg/h via INTRAVENOUS
  Filled 2023-03-28 (×7): qty 100

## 2023-03-28 MED ORDER — CHLORHEXIDINE GLUCONATE CLOTH 2 % EX PADS
6.0000 | MEDICATED_PAD | Freq: Every day | CUTANEOUS | Status: DC
  Administered 2023-03-28 – 2023-03-30 (×5): 6 via TOPICAL

## 2023-03-28 MED ORDER — HYDROMORPHONE HCL 1 MG/ML IJ SOLN
0.5000 mg | INTRAMUSCULAR | Status: DC | PRN
  Administered 2023-03-30 – 2023-03-31 (×5): 0.5 mg via INTRAVENOUS
  Filled 2023-03-28 (×5): qty 1

## 2023-03-28 MED ORDER — ORAL CARE MOUTH RINSE: 15.0000 mL | OROMUCOSAL | Status: DC | PRN

## 2023-03-28 MED ORDER — MIDAZOLAM HCL 2 MG/2ML IJ SOLN
INTRAMUSCULAR | Status: AC
  Filled 2023-03-28: qty 2

## 2023-03-28 MED ORDER — HYDROMORPHONE HCL 1 MG/ML IJ SOLN: 0.5000 mg | INTRAMUSCULAR | Status: AC

## 2023-03-28 MED ORDER — PERAMPANEL 2 MG PO TABS: 12.0000 mg | ORAL_TABLET | Freq: Every day | ORAL | Status: DC

## 2023-03-28 MED ORDER — FENTANYL CITRATE (PF) 100 MCG/2ML IJ SOLN
INTRAMUSCULAR | Status: AC
  Filled 2023-03-28: qty 2

## 2023-03-28 MED ORDER — HYDROMORPHONE HCL 1 MG/ML IJ SOLN: 0.5000 mg | INTRAMUSCULAR | Status: DC

## 2023-03-28 MED ORDER — LORAZEPAM 2 MG/ML IJ SOLN: 1.0000 mg | INTRAMUSCULAR | Status: DC | PRN

## 2023-03-28 NOTE — Progress Notes (Signed)
Admitted to 4NICU  03/28/23 0230  Restraint Order  Length of Order Daily  Assessment  Less Restrictive Interventions Attempted Yes  Health history reviewed prior to applying restraint Yes  Justification  Clinical Justification Pulling lines;Pulling tubes  Plan to Progress Out Reality orientation;Family participation;Continue safety plan  Education  Discontinuation Criteria Alternative intervention effective  Discontinuation Criteria Explained Yes  Family Notification Family not available at this time  Restraint Every 2 Hour Monitoring  Airway Clear with Spontaneous Respirations Yes  Circulation / Skin Integrity No signs of injury/restraints applied correctly;Restraints removed/reapplied correctly  Emotional / Mental Status Agitated/restless;Confused  Range of Motion Performed  Food and Fluids NPO  Elimination External urinary catheter  Patient's rights, dignity, safety maintained Yes  Can Restraints be Less Restrictive or Discontinued? No  Non-violent Restraints  Soft Restraint Right Wrist Continued  Soft Restraint Left Wrist Continued  Soft Restraint Right Ankle Continued  Soft Restraint Left Ankle Continued  Safety Interventions  Less Restrictive Interventions Bed alarm;Observation;Medicated;Active listening  Diversional Activities Patient education TV channels

## 2023-03-28 NOTE — H&P (Addendum)
NAME:  Randy Ray, MRN:  [id number], DOB:  [birth year], LOS: 2 ADMISSION DATE:  03/26/2023, CONSULTATION DATE:  03/28/23 REFERRING MD:  Randy Ray , CHIEF COMPLAINT:  Agitation   History of Present Illness:  Randy Ray is a 67 yo man with hx of COPD, presended with LUE tremors and L side weakness, ams/Confusion on presentation to ED.  Imaging revealed hemorrhagic brain mets. Seizures/simple parital status epilepticus.  Lung cancer - apparent bronchogenic carcinoma with lad and hemorrhagic brain mets.   Was started on Decadron.  Keppra, vimpat. Depakote  Plan for onc and pall care consults.  UTI also had been noted and started on CTX ( though I do not see record of this on charting, I am starting it now)  IR consulted, planned for  Guided bx of L scapular lesion tomorrow 03/28/23  Earlier today was alert, but disoriented and at times confused.  More agitated per note at 740pm.   Given ativan 2mg , haldol, no improvement.     MRI brain: IMPRESSION: Multiple intracranial contrast-enhancing lesions (5 lesions), compatible with metastatic disease. The largest lesion is in the left parietal lobe measuring up to 2.1 cm. Dural-based lesion along the posterior right frontal convexity demonstrates layering blood products.  CT chest abd pelvis: IMPRESSION: 2. 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 3. Isolated osseous metastasis within the left scapula. 5. 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 Emphysema   Pertinent  Medical History  Former smoker, quit 2 y ago.  COPD COVID hx  Home meds: albuterol, vit C, claritin, naproxen, anoro ellipta, zinc  Significant Hospital Events: Including procedures, antibiotic start and stop dates in addition to other pertinent events     Interim History / Subjective:    Objective   Blood pressure (!) 148/80, pulse (!) 110, temperature 97.6 F (36.4 C), temperature source Axillary, resp. rate  18, height 5' 9.6" (1.768 m), weight 90.1 kg, SpO2 97 %.        Intake/Output Summary (Last 24 hours) at 03/28/2023 0118 Last data filed at 03/27/2023 1752 Gross per 24 hour  Intake 392.22 ml  Output 600 ml  Net -207.78 ml   Filed Weights   03/27/23 0055  Weight: 90.1 kg    Examination: General: now calm  HENT: ncat  Lungs: CTAB  Cardiovascular: RRR no mgr  Abdomen: nt, nd, nbs  Extremities: no edema , no erythema  Neuro: calm on precedex.  GU:   Resolved Hospital Problem list     Assessment & Plan:  Agitation: severe delirium in the setting of hemorrhagic brain mets.  Trial on precedex.  EKG pending for baseline If does not tolerate, may need to intubate for sedation and airway.    Remain on EEG if possible.   Decadron 4mg  q 6, vimpat, keppra, valproate,  Has been getting ativan 2mg  q4 today.  Increase prn frequency and dilaudid for possible pain related agitation.   Pos for benzo/cocaine, TCA on UDS.   UTI  - on ctx (starting now)  Apparent lung cancer with hemorrhagic brain mets. Does not appear that onc and palliative have been consulted yet.  Was planned for bx tomorrow.   Seizures: cont current med regimen and EEG per neuro recs.    Best Practice (right click and "Reselect all SmartList Selections" daily)   Diet/type: NPO for now until neurologically more stable in am hopefully  DVT prophylaxis: other GI prophylaxis:  Lines: N/A Foley:  N/A Code Status:  full code  Last date of multidisciplinary goals of care discussion []   Labs   CBC: Recent Labs  Lab 03/26/23 1016 03/27/23 0824  WBC 5.9 3.5*  NEUTROABS 4.0  --   HGB 12.0* 12.4*  HCT 37.6* 37.5*  MCV 87.9 84.8  PLT 303 [id number]    Basic Metabolic Panel: Recent Labs  Lab 03/26/23 1016 03/27/23 0824  NA 140 136  K 4.1 4.2  CL 107 103  CO2 24 22  GLUCOSE 104* 123*  BUN 14 11  CREATININE 1.00 0.85  CALCIUM 9.6 9.7   GFR: Estimated Creatinine Clearance: 97.2 mL/min (by C-G formula based  on SCr of 0.85 mg/dL). Recent Labs  Lab 03/26/23 1016 03/27/23 0824  WBC 5.9 3.5*  LATICACIDVEN 1.5  --     Liver Function Tests: Recent Labs  Lab 03/26/23 1016  AST 17  ALT 10  ALKPHOS 57  BILITOT 0.6  PROT 7.9  ALBUMIN 3.8   No results for input(s): "LIPASE", "AMYLASE" in the last 168 hours. No results for input(s): "AMMONIA" in the last 168 hours.  ABG    Component Value Date/Time   PHART 7.35 02/19/2022 1536   PCO2ART 34 02/19/2022 1536   PO2ART 44 (L) 02/19/2022 1536   HCO3 18.8 (L) 02/19/2022 1536   ACIDBASEDEF 6.0 (H) 02/19/2022 1536   O2SAT 96.5 02/20/2022 0625     Coagulation Profile: No results for input(s): "INR", "PROTIME" in the last 168 hours.  Cardiac Enzymes: No results for input(s): "CKTOTAL", "CKMB", "CKMBINDEX", "TROPONINI" in the last 168 hours.  HbA1C: No results found for: "HGBA1C"  CBG: No results for input(s): "GLUCAP" in the last 168 hours.  Review of Systems:   Unable to assess   Past Medical History:  He,  has a past medical history of COPD (chronic obstructive pulmonary disease) ([place]) and COVID-19.   Surgical History:  History reviewed. No pertinent surgical history.   Social History:   reports that he quit smoking about 2 years ago. His smoking use included cigarettes. He has a 100.00 pack-year smoking history. He has never used smokeless tobacco. He reports that he does not currently use alcohol. He reports that he does not currently use drugs.   Family History:  His family history includes Cancer in his mother; Lung cancer in his father.   Allergies Allergies  Allergen Reactions   Bee Venom Shortness Of Breath   Naprosyn [Naproxen] Other (See Comments)    Stomach upset.     Home Medications  Prior to Admission medications   Medication Sig Start Date End Date Taking? Authorizing Provider  albuterol (VENTOLIN HFA) 108 (90 Base) MCG/ACT inhaler Inhale 2 puffs into the lungs every 6 (six) hours as needed for wheezing  or shortness of breath. 10/27/20  Yes Tyler Pita, MD  naproxen (NAPROSYN) 500 MG tablet Take 1 tablet (500 mg total) by mouth 2 (two) times daily with a meal. Patient taking differently: Take 500 mg by mouth 2 (two) times daily as needed for moderate pain. 03/24/23  Yes Letitia Neri L, PA-C  feeding supplement, ENSURE ENLIVE, (ENSURE ENLIVE) LIQD Take 237 mLs by mouth 3 (three) times daily between meals. Patient not taking: Reported on 02/19/2022 09/13/20   Loletha Grayer, MD  umeclidinium-vilanterol [facility] ELLIPTA) 62.5-25 MCG/INH AEPB Inhale 1 puff into the lungs daily. Patient not taking: Reported on 02/19/2022 10/27/20   Tyler Pita, MD     Critical care time: 45 min

## 2023-03-28 NOTE — Progress Notes (Signed)
Patient became hyper-active delirious with extreme agitation. He would not keep his medical device attachments on, keeps pulling his EEG, and cardiac monitor off and trying to jump out of bed. He has received lorazepam 5mg  and haloperidol within 4 hours of constant agitation with out any improvement. MD and rapid response RN helping with care at the bedside. EKG done for chest discomfort which was consistent with sinus tachy, QT/Qtc  348/457. Consult to ICU called by MD and IV Precedex drip recommended. Report called to [facility] of ICU and patient subsequently transferred. Of note, patient is in 4 point restraints.

## 2023-03-28 NOTE — Procedures (Signed)
Interventional Radiology Procedure Note  Procedure: US guided left scapular mass biopsy  Indication: Left scapular mass  Findings: Please refer to procedural dictation for full description.  Complications: None  EBL: < 10 mL  Miachel Roux, MD [phone]

## 2023-03-28 NOTE — Progress Notes (Signed)
Subjective: Transferred to ICU and started on Precedex overnight due to agitation.  No clinical seizures.  Went for scapular biopsy this morning  ROS: Unable to obtain due to poor mental status  Examination  Vital signs in last 24 hours: Temp:  [96.5 F (35.8 C)-97.6 F (36.4 C)] 96.5 F (35.8 C) (04/01 1150) Pulse Rate:  [61-114] 65 (04/01 1300) Resp:  [15-23] 15 (04/01 1300) BP: (111-159)/(66-117) 136/91 (04/01 1300) SpO2:  [88 %-100 %] 100 % (04/01 1300)  General: lying in bed, not in apparent distress Neuro: States "stop thinking me" to noxious stimuli, did not open eyes, did not follow commands, eyes were deviated upwards, difficult to appreciate pupillary reactivity, no forced gaze deviation, withdraws noxious stimuli in upper extremities with left hemiparesis  Basic Metabolic Panel: Recent Labs  Lab 03/26/23 1016 03/27/23 0824  NA 140 136  K 4.1 4.2  CL 107 103  CO2 24 22  GLUCOSE 104* 123*  BUN 14 11  CREATININE 1.00 0.85  CALCIUM 9.6 9.7    CBC: Recent Labs  Lab 03/26/23 1016 03/27/23 0824  WBC 5.9 3.5*  NEUTROABS 4.0  --   HGB 12.0* 12.4*  HCT 37.6* 37.5*  MCV 87.9 84.8  PLT 303 310     Coagulation Studies: Recent Labs    03/28/23 0540  LABPROT 14.3  INR 1.1    Imaging MRI brain with and without contrast 03/26/2023: Multiple intracranial contrast-enhancing lesions (5 lesions), compatible with metastatic disease. The largest lesion is in the left parietal lobe measuring up to 2.1 cm. Dural-based lesion along the posterior right frontal convexity demonstrates layering blood products.  CT chest Abdo pelvis with contrast 03/26/2023:  1. Mild patient position degradation. 2. 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 3. Isolated osseous metastasis within the left scapula. 4. No subdiaphragmatic soft tissue metastasis identified. 5. 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 6. Aortic  atherosclerosis (ICD10-I70.0) and emphysema (ICD10-J43.9).    ASSESSMENT AND PLAN: 67 year old male presented with focal convulsive status epilepticus (left upper extremity jerking) which has since resolved.  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  > 50% of time was spent in direct patient care.   Zeb Comfort Epilepsy [facility] For questions after 5pm please refer to AMION to reach the Neurologist on call

## 2023-03-28 NOTE — Progress Notes (Signed)
[facility] Progress Note  Patient: Randy Ray    [id number]  DOA: 03/26/2023    Date of Service: the patient was seen and examined on 03/28/2023  Brief hospital course: Patient is a 67 year old male with past medical history of COPD and tobacco abuse who presented to the emergency room on 3/30 complaining of left upper extremity tremors and left-sided weakness as well as confusion.  He had been seen 2 days prior to emergency room for numbness of left upper extremity and was discharged home.  Since then, he started having jerking of his left upper extremity and twitching of his left side of his face.  Emergency room, CT scan of head revealed hemorrhagic brain metastases (new finding/new diagnosis of cancer) and patient was transferred to [facility] for EEG.  Further workup revealed what appeared to be primary lung cancer.  Other labs noteworthy for urinary tract infection and interjecting positive for cocaine and tricyclic antidepressants.  Patient admitted to the hospitalist service with neurology consultation and started on antiepileptics.  Following admission, EEG done morning of 3/31 noting electrographic status epilepticus arising from right central parietal region confirming ongoing epilepsy.  As day progressed, patient became more more agitated despite doses of Ativan.  By that night, transferred to ICU on Precedex drip with critical care following.  Patient underwent ultrasound-guided biopsy of left scapular mass by interventional radiology on 4/1.   Assessment and Plan: * Simple partial status epilepticus Having active seizures while awake, conscious and alert.  Appreciate neurology help.  Status post EEG confirming status.  Patient started on Keppra and Vimpat on arrival.  Neurology adding Depakote 3/31.  Lung cancer metastatic to brain New diagnosis.  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  Oncology to see.  Status post left scapular mass biopsy 4/1 with pathology pending.   Palliative care consulted as well  Acute metabolic encephalopathy Agitation worsens requiring Precedex drip.  Critical care following.  Chronic obstructive pulmonary disease Initially not hypoxic and just on as needed inhalers.  Following additional sedation, placed on 3 L nasal cannula.  UTI (urinary tract infection) Urine cultures growing out greater than 100,000 colonies of E. coli with sensitivities pending.  Started on IV Rocephin 3/31.  Cocaine abuse UDS positive for cocaine and TCA.  Will counsel eventually, but suspect source of seizures are from hemorrhagic brain mets more likely than cocaine.  Overweight (BMI 25.0-29.9) Meets criteria with BMI greater than 25       Body mass index is 28.83 kg/m.        Consultants: Interventional radiology Neurology Critical care  Procedures: EEG Left scapular biopsy  Antimicrobials: IV Rocephin 3/31-present  Code Status: Full code   Subjective: Currently sedated  Objective: Vital signs were reviewed and unremarkable. Vitals:   03/28/23 1105 03/28/23 1110  BP: 124/77 124/82  Pulse: 61 61  Resp: 18 17  Temp:    SpO2: 100% 100%    Intake/Output Summary (Last 24 hours) at 03/28/2023 1139 Last data filed at 03/28/2023 1000 Gross per 24 hour  Intake 2607.88 ml  Output 1500 ml  Net 1107.88 ml   Filed Weights   03/27/23 0055  Weight: 90.1 kg   Body mass index is 28.83 kg/m.  Exam:  General: Sedated HEENT: Normocephalic, atraumatic, EEG leads placed Cardiovascular: Regular rate and rhythm, S1-S2 Respiratory: Clear to auscultation bilaterally Abdomen: Soft nontender, nondistended, positive bowel sounds Musculoskeletal: No clubbing or cyanosis or edema Skin: No skin breaks, tears or lesions Psychiatry: Confused, receiving sedation Neurology: Limited due  to sedation  Data Reviewed: INR 1.1  Disposition:  Status is: Inpatient Remains inpatient appropriate because:  -Pathology from biopsy -Evaluation by  oncology -Palliative care consultation -Improvement in mentation    Anticipated discharge date: 4/5    Family Communication: Will call family DVT Prophylaxis: SCDs Start: 03/26/23 2236    Author: Annita Brod ,MD 03/28/2023 11:39 AM  To reach On-call, see care teams to locate the attending and reach out via [email or web address]. Between 7PM-7AM, please contact night-coverage If you still have difficulty reaching the attending provider, please page the [facility] (Director on Call) for [facility] on amion for assistance.

## 2023-03-28 NOTE — Progress Notes (Signed)
[facility] Progress Note Patient Name: Randy Ray DOB: 24-Sep-1956 MRN: [id number]   Date of Service  03/28/2023  HPI/Events of Note  89 M history of COPD initially presented 3/28  with LUE jerking movements and altered sensorium. On work up bronchogenic mass with concern for hemorrhagic metastasis. Started on AEDs and steroids.  eICU Interventions  ICU transfer overnight 4/1 due to episodes of agitation and started on Precedex drip     Intervention Category Evaluation Type: New Patient Evaluation  Judd Lien 03/28/2023, 2:33 AM

## 2023-03-28 NOTE — Procedures (Signed)
Patient Name: Randy Ray  MRN: [id number]  Epilepsy Attending: Lora Havens  Referring Physician/Provider: Donnetta Simpers, MD  Duration: 03/27/2023 2240 to 03/29/2023 0200   Patient history: 67 yo man with hx tobacco abuse who presented with AMS and LUE focal status. EEG to evaluate for seizure.    Level of alertness: Awake, asleep   AEDs during EEG study: LEV, LCM, VPA   Technical aspects: This EEG study was done with scalp electrodes positioned according to the 10-20 International system of electrode placement. Electrical activity was reviewed with band pass filter of 1-70Hz , sensitivity of 7 uV/mm, display speed of 83mm/sec with a 60Hz  notched filter applied as appropriate. EEG data were recorded continuously and digitally stored.  Video monitoring was available and reviewed as appropriate.   Description: At the beginning of study, no clear posterior dominant rhythm was seen. Sleep was characterized by vertex waves, sleep spindles (12 to 14 Hz), maximal frontocentral region. EEG showed continuous generalized and maximal right centro-parietal region 3 to 6 Hz theta-delta slowing. EEG also showed lateralized periodic discharges at 0.5- 1hz  in right hemisphere, maximal right centro-parietal region. Gradually, EEG improved and showed posterior dominant rhythm of 9-10 Hz activity of moderate voltage (25-35 uV) seen predominantly in posterior head regions, symmetric and reactive to eye opening and eye closing.  Hyperventilation and photic stimulation were not performed.     Of note, parts of study were difficult to interpret due to significant electrode artifact. Study was disconnected between 03/27/2023 0145 to [place] and 03/28/2023 0947 to 1122.    ABNORMALITY - Lateralized periodic discharges, right hemisphere, maximal right centro-parietal region ( LPD) - Continuous slow, generalized and maximal right centro-parietal region   IMPRESSION: This study showed evidence of epileptogenicity  arising from right hemisphere, maximal right centro-parietal region with increased risk of seizure recurrence. Additionally there was cortical dysfunction in right centro-parietal region likely due to seizures, underlying structural abnormality. Lastly there was mild to moderate diffuse encephalopathy. No definite seizures were seen during this study.   Mackenzie Groom Barbra Sarks

## 2023-03-28 NOTE — Progress Notes (Addendum)
The patient continued to be severely agitated and aggressive despite 5 mg IV Ativan and 1 mg IM Haldol in less than 4 hours. CCM consulted.  Precedex drip initiated.  The patient was transferred to the ICU for Precedex drip.  Updated the patient's sister, Karle Starch, via phone who stated that she is the medical POA.  All questions answered to the best of my ability.

## 2023-03-28 NOTE — Significant Event (Signed)
Rapid Response Event Note   Reason for Call :  Pt agitated, physically and verbally agressive  Initial Focused Assessment:  On arrival, extremely agitated. Attempting to hit and kick staff while in 4 point restraint. Md at bedside. PIV removed while pt thrashing in bed. Pt confused and yelling throughout event. Skin warm and diaphoretic.   VS; HR 115, BP 159/112, RR 22, spO2 95% on 3L [place]   Interventions:  PIV placed to LFA Precedex gtt started  Plan of Care:  Transfer to ICU for Precedex gtt and more frequent monitoring.    Event Summary:   MD Notified: Dr Nevada Crane- PTA. Dr Duwayne Heck- CCM Call Time: 0107 Arrival Time: 0109 End Time: 0225  Sherilyn Dacosta, RN

## 2023-03-28 NOTE — Consult Note (Signed)
Palliative Care Consult Note                                  Date: 03/28/2023   Patient Name: Randy Ray  DOB: Jan 28, 1956  MRN: [id number]  Age / Sex: 67 y.o., male  PCP: Pcp, No Referring Physician: Annita Brod, MD  Reason for Consultation: Establishing goals of care  HPI/Patient Profile: 67 y.o. male  with past medical history of COPD and tobacco abuse who presented to the emergency room on 3/30 complaining of left upper extremity tremors and left-sided weakness as well as confusion. He was admitted on 03/26/2023 with simple partial status epilepticus, lung cancer with mets to brain (newly diagnosed), acute metabolic cephalopathy, cocaine abuse, and others.   PMT was consulted for Randy Ray conversations.  Past Medical History:  Diagnosis Date   COPD (chronic obstructive pulmonary disease) ([place])    COVID-19     Subjective:   This NP Walden Field reviewed medical records, received report from team, assessed the patient and then meet at the patient's bedside to discuss diagnosis, prognosis, GOC, EOL wishes disposition and options.  I met with patient at the bedside.  No family was present.  Later I called his sister Randy Ray.   Concept of Palliative Care was introduced as specialized medical care for people and their families living with serious illness.  If focuses on providing relief from the symptoms and stress of a serious illness.  The goal is to improve quality of life for both the patient and the family. Values and goals of care important to patient and family were attempted to be elicited.  Created space and opportunity for patient  and family to explore thoughts and feelings regarding current medical situation   Natural trajectory and current clinical status were discussed. Questions and concerns addressed. Patient  encouraged to call with questions or concerns.    Patient/Family Understanding of Illness: The patient's  sister [id number] h3 has been using cocaine and there's "something with his brain." We fully discussed his current acute illness including agitation, apparent lung CA with mets to brain (path pending), and acute metabolic encephalopathy.   Life Review: The patient has 2 brothers and a sister. Randy Ray is his girfriend/lives with him but not legally married or legally attached to him. He previously worked as a Advertising copywriter for [employer]. Most recently he worked in [employer] for the past few months. He is not a person of faith/does not subscripe to a religious practice.  Goals: TBD after family meeting  Today's Discussion: In addition to conversations described above I had significant discussions with the patient's sister on the phone. Sister Randy Ray states she's been his decision maker since the 14s, however there is no completed POA paperwork. She states all his siblings are involved in his life and get along well. We discussed that she doesn't feel he would want resuscitation, but hasn't spoke to him or his other siblings about this specifically. We planned to have a family meeting in person tomorrow around 9:30 in the morning.  I provided emotional and general support through therapeutic listening, empathy, sharing of stories, and other techniques. I answered all questions and addressed all concerns to the best of my ability.  Review of Systems  Unable to perform ROS: Mental status change    Objective:   Primary Diagnoses: Present on Admission:  Simple partial status epilepticus  Lung cancer metastatic to brain  Cocaine abuse  UTI (urinary tract infection)  Chronic obstructive pulmonary disease  Overweight (BMI 25.0-29.9)  Acute metabolic encephalopathy   Physical Exam Vitals reviewed.  Constitutional:      General: He is sleeping.     Comments: Awakens to voice, incoherent speech  HENT:     Head: Normocephalic and atraumatic.     Comments: Noted bandage in place s/p IR  scalp lesion biopsy Cardiovascular:     Rate and Rhythm: Normal rate.  Pulmonary:     Effort: Pulmonary effort is normal. No respiratory distress.  Abdominal:     General: Abdomen is flat. Bowel sounds are normal. There is no distension.     Palpations: Abdomen is soft.  Skin:    General: Skin is warm and dry.  Neurological:     Mental Status: He is disoriented and confused.     Vital Signs:  BP (!) 136/91   Pulse 65   Temp (!) 96.5 F (35.8 C) (Axillary) Comment: Reported to the nurse, warm blankets applied  Resp 15   Ht 5' 9.6" (1.768 m)   Wt 90.1 kg   SpO2 100%   BMI 28.83 kg/m   Palliative Assessment/Data: 10%    Advanced Care Planning:   Existing Vynca/ACP Documentation: None  Primary Decision Maker: NEXT OF KIN  Code Status/Advance Care Planning: Full code  A discussion was had today regarding advanced directives. Concepts specific to code status, artifical feeding and hydration, continued IV antibiotics and rehospitalization was had.  The difference between a aggressive medical intervention path and a palliative comfort care path for this patient at this time was had.   Decisions/Changes to ACP: None today  Assessment & Plan:   Impression: 67 year old male with chronic comorbidities and acute presentations described above.  The patient is severely agitated requiring moved to ICU for Precedex.  Newly diagnosed likely lung cancer with mets to brain (5 lesions noted on MRI of the brain, with some hemorrhagic association).  The patient is responsive to touch but very agitated requiring 4 point restraints.  Discussed with patient's sister who does not think he would want resuscitation.  However, he does have 2 other brothers I will need to attempt to get in touch with.  Will plan for family meeting in person tomorrow at 26 with the patient's sister.  We can discuss speaking with other family members at that time.  Overall prognosis poor.  SUMMARY OF  RECOMMENDATIONS   Remain DNR for now Full scope of care Family meeting tomorrow at 9:30 with patient's sister, possibly other family Further recommendations pending [place] discussions PMT will continue to follow  Symptom Management:  Per primary team PMT is available to assist as needed  Prognosis:  Unable to determine  Discharge Planning:  To Be Determined   Discussed with: Patient's family, medical team, nursing team    Thank you for allowing [place] to participate in the care of Randy Ray PMT will continue to support holistically.  Time Total: 80 min  Greater than 50%  of this time was spent counseling and coordinating care related to the above assessment and plan.  Signed by: Walden Field, NP Palliative Medicine Team  Team Phone # [phone] (Nights/Weekends)  03/28/2023, 1:50 PM

## 2023-03-28 NOTE — Progress Notes (Signed)
LTM maint complete - no skin breakdown under: FZ, 01

## 2023-03-28 DEATH — deceased

## 2023-03-29 DIAGNOSIS — G9341 Metabolic encephalopathy: Secondary | ICD-10-CM | POA: Diagnosis not present

## 2023-03-29 DIAGNOSIS — Z7189 Other specified counseling: Secondary | ICD-10-CM | POA: Diagnosis not present

## 2023-03-29 DIAGNOSIS — J449 Chronic obstructive pulmonary disease, unspecified: Secondary | ICD-10-CM | POA: Diagnosis not present

## 2023-03-29 DIAGNOSIS — Z66 Do not resuscitate: Secondary | ICD-10-CM | POA: Diagnosis not present

## 2023-03-29 DIAGNOSIS — N10 Acute tubulo-interstitial nephritis: Secondary | ICD-10-CM

## 2023-03-29 DIAGNOSIS — F1410 Cocaine abuse, uncomplicated: Secondary | ICD-10-CM | POA: Diagnosis not present

## 2023-03-29 DIAGNOSIS — Z515 Encounter for palliative care: Secondary | ICD-10-CM | POA: Diagnosis not present

## 2023-03-29 DIAGNOSIS — C3490 Malignant neoplasm of unspecified part of unspecified bronchus or lung: Secondary | ICD-10-CM | POA: Diagnosis not present

## 2023-03-29 DIAGNOSIS — G40101 Localization-related (focal) (partial) symptomatic epilepsy and epileptic syndromes with simple partial seizures, not intractable, with status epilepticus: Secondary | ICD-10-CM | POA: Diagnosis not present

## 2023-03-29 LAB — CBC
HCT: 35.9 % — ABNORMAL LOW (ref 39.0–52.0)
Hemoglobin: 12.2 g/dL — ABNORMAL LOW (ref 13.0–17.0)
MCH: 28.5 pg (ref 26.0–34.0)
MCHC: 34 g/dL (ref 30.0–36.0)
MCV: 83.9 fL (ref 80.0–100.0)
Platelets: 305 10*3/uL (ref 150–400)
RBC: 4.28 MIL/uL (ref 4.22–5.81)
RDW: 13.3 % (ref 11.5–15.5)
WBC: 3.1 10*3/uL — ABNORMAL LOW (ref 4.0–10.5)
nRBC: 0 % (ref 0.0–0.2)

## 2023-03-29 LAB — BASIC METABOLIC PANEL
Anion gap: 12 (ref 5–15)
BUN: 15 mg/dL (ref 8–23)
CO2: 23 mmol/L (ref 22–32)
Calcium: 9.5 mg/dL (ref 8.9–10.3)
Chloride: 105 mmol/L (ref 98–111)
Creatinine, Ser: 0.85 mg/dL (ref 0.61–1.24)
GFR, Estimated: >60 mL/min (ref >60)
Glucose, Bld: 102 mg/dL — ABNORMAL HIGH (ref 70–99)
Potassium: 3.8 mmol/L (ref 3.5–5.1)
Sodium: 140 mmol/L (ref 135–145)

## 2023-03-29 LAB — URINE CULTURE: Culture: >=100000 — AB

## 2023-03-29 MED ORDER — SODIUM CHLORIDE 0.9 % IV SOLN
1.0000 mg | Freq: Once | INTRAVENOUS | Status: AC
  Administered 2023-03-29: 1 mg via INTRAVENOUS
  Filled 2023-03-29: qty 0.2

## 2023-03-29 MED ORDER — LORAZEPAM 2 MG/ML IJ SOLN
0.0000 mg | INTRAMUSCULAR | Status: DC
  Administered 2023-03-29 – 2023-03-30 (×7): 2 mg via INTRAVENOUS
  Administered 2023-03-30: 1 mg via INTRAVENOUS
  Filled 2023-03-29 (×8): qty 1

## 2023-03-29 MED ORDER — THIAMINE MONONITRATE 100 MG PO TABS
100.0000 mg | ORAL_TABLET | Freq: Every day | ORAL | Status: DC
Start: 2023-03-29 — End: 2023-03-29

## 2023-03-29 MED ORDER — LORAZEPAM 2 MG/ML IJ SOLN
1.0000 mg | INTRAMUSCULAR | Status: DC | PRN
  Administered 2023-03-29 – 2023-03-31 (×5): 2 mg via INTRAVENOUS
  Administered 2023-03-31: 1 mg via INTRAVENOUS
  Filled 2023-03-29: qty 2
  Filled 2023-03-29 (×3): qty 1

## 2023-03-29 MED ORDER — FOLIC ACID 1 MG PO TABS: 1.0000 mg | ORAL_TABLET | Freq: Every day | ORAL | Status: DC

## 2023-03-29 MED ORDER — MIDAZOLAM HCL 2 MG/2ML IJ SOLN: 1.0000 mg | INTRAMUSCULAR | Status: DC | PRN

## 2023-03-29 MED ORDER — FOLIC ACID 1 MG PO TABS
1.0000 mg | ORAL_TABLET | Freq: Every day | ORAL | Status: DC
Start: 2023-03-29 — End: 2023-03-29

## 2023-03-29 MED ORDER — VALPROATE SODIUM 100 MG/ML IV SOLN
750.0000 mg | Freq: Two times a day (BID) | INTRAVENOUS | Status: DC
  Administered 2023-03-29 – 2023-03-31 (×4): 750 mg via INTRAVENOUS
  Filled 2023-03-29 (×6): qty 7.5

## 2023-03-29 MED ORDER — LORAZEPAM 2 MG/ML IJ SOLN: 0.0000 mg | Freq: Three times a day (TID) | INTRAMUSCULAR | Status: DC

## 2023-03-29 MED ORDER — OLANZAPINE 5 MG PO TABS
7.5000 mg | ORAL_TABLET | Freq: Every day | ORAL | Status: DC
  Filled 2023-03-29 (×2): qty 1

## 2023-03-29 MED ORDER — THIAMINE HCL 100 MG/ML IJ SOLN
100.0000 mg | Freq: Every day | INTRAMUSCULAR | Status: DC
  Administered 2023-03-29 – 2023-03-30 (×2): 100 mg via INTRAVENOUS
  Filled 2023-03-29 (×2): qty 2

## 2023-03-29 MED ORDER — ADULT MULTIVITAMIN W/MINERALS CH: 1.0000 | ORAL_TABLET | Freq: Every day | ORAL | Status: DC

## 2023-03-29 MED ORDER — LORAZEPAM 1 MG PO TABS: 1.0000 mg | ORAL_TABLET | ORAL | Status: DC | PRN

## 2023-03-29 MED ORDER — NICOTINE 21 MG/24HR TD PT24
21.0000 mg | MEDICATED_PATCH | Freq: Every day | TRANSDERMAL | Status: DC
  Administered 2023-03-29 – 2023-04-02 (×5): 21 mg via TRANSDERMAL
  Filled 2023-03-29 (×5): qty 1

## 2023-03-29 MED ORDER — PHENOBARBITAL SODIUM 65 MG/ML IJ SOLN
65.0000 mg | Freq: Every day | INTRAMUSCULAR | Status: DC
  Administered 2023-03-29 – 2023-04-04 (×7): 65 mg via INTRAVENOUS
  Filled 2023-03-29 (×7): qty 1

## 2023-03-29 NOTE — Progress Notes (Signed)
Subjective: No clinical seizures.  However has been requiring intermittent Precedex because of agitation.  Per nurse, patient has history of daily alcohol use.  ROS: Unable to obtain due to poor mental status  Examination  Vital signs in last 24 hours: Temp:  [96 F (35.6 C)-96.6 F (35.9 C)] 96 F (35.6 C) (04/02 0800) Pulse Rate:  [47-71] 53 (04/02 0700) Resp:  [0-22] 12 (04/02 0700) BP: (107-183)/(66-129) 107/66 (04/02 0700) SpO2:  [96 %-100 %] 96 % (04/02 0700)  General: lying in bed, not in apparent distress Neuro: Barely opens eyes to noxious stimuli, mumbles his name, did not tell me where he is but per RN TWC that he continues at home, following simple one-step commands at times like sticking out his tongue, PERRLA, no forced gaze deviation,withdraws noxious stimuli in all extremities with left hemiparesis   Basic Metabolic Panel: Recent Labs  Lab 03/26/23 1016 03/27/23 0824 03/29/23 0923  NA 140 136 140  K 4.1 4.2 3.8  CL 107 103 105  CO2 24 22 23   GLUCOSE 104* 123* 102*  BUN 14 11 15   CREATININE 1.00 0.85 0.85  CALCIUM 9.6 9.7 9.5    CBC: Recent Labs  Lab 03/26/23 1016 03/27/23 0824 03/29/23 0923  WBC 5.9 3.5* 3.1*  NEUTROABS 4.0  --   --   HGB 12.0* 12.4* 12.2*  HCT 37.6* 37.5* 35.9*  MCV 87.9 84.8 83.9  PLT 303 310 305     Coagulation Studies: Recent Labs    03/28/23 0540  LABPROT 14.3  INR 1.1    Imaging No new brain imaging  ASSESSMENT AND PLAN: 67 year old male presented with focal convulsive status epilepticus (left upper extremity jerking) which has since resolved.   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  Can gradually taper once more awake. CIWA protocol - DC LTM EEG tomorrow as no further seizures overnight -Continue management of rest of comorbidities per primary team -Discussed plan with Dr. [place] Pink via secure chat   I have spent a total of  36 minutes with the patient reviewing hospital notes,  test results, labs and examining the patient.  > 50% of time was spent in direct patient care.    Zeb Comfort Epilepsy [facility] For questions after 5pm please refer to AMION to reach the Neurologist on call

## 2023-03-29 NOTE — Procedures (Addendum)
Patient Name: Randy Ray  MRN: [id number]  Epilepsy Attending: Lora Havens  Referring Physician/Provider: Donnetta Simpers, MD  Duration: 03/29/2023 0200 to 03/29/2023 1112   Patient history: 67 yo man with hx tobacco abuse who presented with AMS and LUE focal status. EEG to evaluate for seizure.    Level of alertness: Awake, asleep   AEDs during EEG study: LEV, LCM, VPA   Technical aspects: This EEG study was done with scalp electrodes positioned according to the 10-20 International system of electrode placement. Electrical activity was reviewed with band pass filter of 1-70Hz , sensitivity of 7 uV/mm, display speed of 47mm/sec with a 60Hz  notched filter applied as appropriate. EEG data were recorded continuously and digitally stored.  Video monitoring was available and reviewed as appropriate.   Description: The study showed posterior dominant rhythm of 9-10 Hz activity of moderate voltage (25-35 uV) seen predominantly in posterior head regions, symmetric and reactive to eye opening and eye closing. Sleep was characterized by vertex waves, sleep spindles (12 to 14 Hz), maximal frontocentral region. EEG showed continuous generalized and maximal right centro-parietal region 3 to 6 Hz theta-delta slowing. EEG also showed lateralized periodic discharges at 0.5- 1hz  in right hemisphere, maximal right centro-parietal region.    ABNORMALITY - Lateralized periodic discharges, right hemisphere, maximal right centro-parietal region ( LPD) - Continuous slow, generalized and maximal right centro-parietal region   IMPRESSION: This study showed evidence of epileptogenicity arising from right hemisphere, maximal right centro-parietal region with increased risk of seizure recurrence. Additionally there was cortical dysfunction in right centro-parietal region likely due to seizures, underlying structural abnormality. Lastly there was mild diffuse encephalopathy. No definite seizures were seen during this  study.   Shiri Hodapp Barbra Sarks

## 2023-03-29 NOTE — Progress Notes (Signed)
NAME:  Randy Ray, MRN:  [id number], DOB:  [birth year], LOS: 3 ADMISSION DATE:  03/26/2023, CONSULTATION DATE:  03/28/23 REFERRING MD:  [place] Crane , CHIEF COMPLAINT:  Agitation   History of Present Illness:  Mr. Randy Ray is a 67 yo man with hx of COPD, presended with LUE tremors and L side weakness, ams/Confusion on presentation to ED.  Imaging revealed hemorrhagic brain mets. Seizures/simple parital status epilepticus.  Lung cancer - apparent bronchogenic carcinoma with lad and hemorrhagic brain mets.   Was started on Decadron.  Keppra, vimpat. Depakote  Plan for onc and pall care consults.  UTI also had been noted and started on CTX ( though I do not see record of this on charting, I am starting it now)  IR consulted, planned for  Guided bx of L scapular lesion tomorrow 03/28/23  Earlier today was alert, but disoriented and at times confused.  More agitated per note at 740pm.   Given ativan 2mg , haldol, no improvement.     MRI brain: IMPRESSION: Multiple intracranial contrast-enhancing lesions (5 lesions), compatible with metastatic disease. The largest lesion is in the left parietal lobe measuring up to 2.1 cm. Dural-based lesion along the posterior right frontal convexity demonstrates layering blood products.  CT chest abd pelvis: IMPRESSION: 2. 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 3. Isolated osseous metastasis within the left scapula. 5. 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 Emphysema   Pertinent  Medical History  Former smoker, quit 2 y ago.  COPD COVID hx  Home meds: albuterol, vit C, claritin, naproxen, anoro ellipta, zinc  Significant Hospital Events: Including procedures, antibiotic start and stop dates in addition to other pertinent events     Interim History / Subjective:  Patient remained agitated and restless in the bed He became bradycardic with Precedex infusion of 1.2 mics EEG showed no active  seizures Remain afebrile  Objective   Blood pressure (!) 138/122, pulse 97, temperature (!) 95.8 F (35.4 C), temperature source Axillary, resp. rate (!) 24, height 5' 9.6" (1.768 m), weight 90.1 kg, SpO2 100 %.        Intake/Output Summary (Last 24 hours) at 03/29/2023 1833 Last data filed at 03/29/2023 1600 Gross per 24 hour  Intake 2758.59 ml  Output 2600 ml  Net 158.59 ml   Filed Weights   03/27/23 0055  Weight: 90.1 kg    Examination: Physical exam: General: Chronically ill-appearing male, lying on the bed, agitated and restless HEENT: [place]/AT, eyes anicteric.  moist mucus membranes Neuro: Eyes closed, does not open, not following commands Chest: Coarse breath sounds, no wheezes or rhonchi Heart: Regular rate and rhythm, no murmurs or gallops Abdomen: Soft, nontender, nondistended, bowel sounds present Skin: No rash   Resolved Hospital Problem list     Assessment & Plan:  Acute hyperactive delirium with agitation likely in the setting of hemorrhagic mets  Alcohol dependence, likely in alcohol withdrawal now Polysubstance abuse Continue Precedex infusion with max dose 0.8 mics to prevent bradycardia Continue phenobarbital which will help with alcohol withdrawal and substance withdrawal Continue CIWA protocol Started on Zyprexa 7.5 mg at bedtime Continue thiamine and folate Continue IV fluid  Complex partial status epilepticus Patient presented with altered mental status EEG confirmed status epilepticus, now no more seizures but high risk of recurrence Neurology is following appreciate their recommendation Continue Keppra, Vimpat, valproic acid Continue seizure and fall precautions  Acute encephalopathy, combination of toxic with polysubstance and postictal Avoid deep sedation  Metastatic  lung cancer to brain Patient was noted to have multiple hemorrhagic metastatic brain lesions CT chest abdomen pelvis showing likely bronchogenic carcinoma Biopsy was  done Palliative care is following  Acute urinary tract infection with E. coli Continue IV ceftriaxone Urine culture showed E. coli which is pansensitive  COPD, not in exacerbation Continue nebs  Tobacco dependence Continue nicotine patch to prevent continued salt  Best Practice (right click and "Reselect all SmartList Selections" daily)   Diet/type: NPO for now until neurologically more stable in am hopefully  DVT prophylaxis: SCD GI prophylaxis: N/A Lines: N/A Foley:  N/A Code Status: DNR Last date of multidisciplinary goals of care discussion [4/2: Please see palliative care note]  Labs   CBC: Recent Labs  Lab 03/26/23 1016 03/27/23 0824 03/29/23 0923  WBC 5.9 3.5* 3.1*  NEUTROABS 4.0  --   --   HGB 12.0* 12.4* 12.2*  HCT 37.6* 37.5* 35.9*  MCV 87.9 84.8 83.9  PLT 303 310 [id number]    Basic Metabolic Panel: Recent Labs  Lab 03/26/23 1016 03/27/23 0824 03/29/23 0923  NA 140 136 140  K 4.1 4.2 3.8  CL 107 103 105  CO2 24 22 23   GLUCOSE 104* 123* 102*  BUN 14 11 15   CREATININE 1.00 0.85 0.85  CALCIUM 9.6 9.7 9.5   GFR: Estimated Creatinine Clearance: 97.2 mL/min (by C-G formula based on SCr of 0.85 mg/dL). Recent Labs  Lab 03/26/23 1016 03/27/23 0824 03/29/23 0923  WBC 5.9 3.5* 3.1*  LATICACIDVEN 1.5  --   --     Liver Function Tests: Recent Labs  Lab 03/26/23 1016  AST 17  ALT 10  ALKPHOS 57  BILITOT 0.6  PROT 7.9  ALBUMIN 3.8   No results for input(s): "LIPASE", "AMYLASE" in the last 168 hours. Recent Labs  Lab 03/28/23 1459  AMMONIA 39*    ABG    Component Value Date/Time   PHART 7.35 02/19/2022 1536   PCO2ART 34 02/19/2022 1536   PO2ART 44 (L) 02/19/2022 1536   HCO3 18.8 (L) 02/19/2022 1536   ACIDBASEDEF 6.0 (H) 02/19/2022 1536   O2SAT 96.5 02/20/2022 0625     Coagulation Profile: Recent Labs  Lab 03/28/23 0540  INR 1.1    Cardiac Enzymes: No results for input(s): "CKTOTAL", "CKMB", "CKMBINDEX", "TROPONINI" in the last  168 hours.  HbA1C: No results found for: "HGBA1C"  CBG: No results for input(s): "GLUCAP" in the last 168 hours.  This patient is critically ill with multiple organ system failure which requires frequent high complexity decision making, assessment, support, evaluation, and titration of therapies. This was completed through the application of advanced monitoring technologies and extensive interpretation of multiple databases.  During this encounter critical care time was devoted to patient care services described in this note for 38 minutes.    Jacky Kindle, MD  Pulmonary Critical Care See Amion for pager If no response to pager, please call [phone] until 7pm After 7pm, Please call E-link [phone]

## 2023-03-29 NOTE — Progress Notes (Signed)
Daily Progress Note   Patient Name: Randy Ray       Date: 03/29/2023 DOB: June 23, 1956  Age: 67 y.o. MRN#: [id number] Attending Physician: Annita Brod, MD Primary Care Physician: Pcp, No Admit Date: 03/26/2023 Length of Stay: 3 days  Reason for Consultation/Follow-up: Establishing goals of care  HPI/Patient Profile:  67 y.o. male  with past medical history of COPD and tobacco abuse who presented to the emergency room on 3/30 complaining of left upper extremity tremors and left-sided weakness as well as confusion. He was admitted on 03/26/2023 with simple partial status epilepticus, lung cancer with mets to brain (newly diagnosed), acute metabolic cephalopathy, cocaine abuse, and others.    PMT was consulted for [place] conversations.  Subjective:   Subjective: Chart Reviewed. Updates received. Patient Assessed. Created space and opportunity for patient  and family to explore thoughts and feelings regarding current medical situation.  Today's Discussion: Today I saw the patient at bedside along with the patient's sister Narda Rutherford and niece Altamese Dilling; the patient's brothers Deidre Ala "June" and that he is were present via telephone.  We reviewed the patient's current medical situation.  We discussed altered mental status with workup revealing likely lung cancer with brain metastasis.  This included reviewing images with physically present family members documenting the metastatic lesions of the brain.  We discussed possible seizures also related to brain mets.  We discussed that all of this is not surprising giving the metastasis to the brain.  We are still waiting for definitive pathology.  Today he seems to be much more somnolent.  Earlier this morning the patient was agitated even on Precedex and was given some Ativan as well.  We discussed scope of care including full and aggressive care versus comfort care and all options in between.  We discussed CODE STATUS.  After substantial discussion and what  this would mean for the patient they have elected DNR status.  They agree that resuscitation would not improve his situation regardless.  Otherwise, they would like time for pathology results to come back before making further decisions.  We did also discuss nutrition when the patient's niece asked about this.  We discussed that we can go some time without nutrition.  However, if it gets to be too long there is an option for a temporary feeding tube.  We discussed the importance of nutrition and strengthening.  However, if the patient is unable to participate in therapy and get out of bed, nutrition would likely not improve his strength and conditioning either way.  They seem to understand this.  We discussed that if we got to the point where permanent feeding tube needed to be discussed, we would have further conversations.  I provided emotional and general support through therapeutic listening, empathy, sharing of stories, therapeutic touch, and other techniques. I answered all questions and addressed all concerns to the best of my ability.  Review of Systems  Unable to perform ROS: Mental status change    Objective:   Vital Signs:  BP 107/66   Pulse (!) 53   Temp (!) 96 F (35.6 C) (Axillary)   Resp 12   Ht 5' 9.6" (1.768 m)   Wt 90.1 kg   SpO2 96%   BMI 28.83 kg/m   Physical Exam: Physical Exam Vitals and nursing note reviewed.  Constitutional:      General: He is not in acute distress.    Appearance: He is ill-appearing.  HENT:     Head: Normocephalic and atraumatic.  Comments: Noted dressing in place after IR biopsy Cardiovascular:     Rate and Rhythm: Normal rate.  Pulmonary:     Effort: Pulmonary effort is normal. No respiratory distress.  Abdominal:     General: Abdomen is flat.     Palpations: Abdomen is soft.  Skin:    General: Skin is warm and dry.     Palliative Assessment/Data: 10%    Existing Vynca/ACP Documentation: None  Assessment & Plan:    Impression: Present on Admission:  Simple partial status epilepticus  Lung cancer metastatic to brain  Cocaine abuse  UTI (urinary tract infection)  Chronic obstructive pulmonary disease  Overweight (BMI 25.0-29.9)  Acute metabolic encephalopathy  SUMMARY OF RECOMMENDATIONS   DNR Time for outcomes, especially pathology results Further [place] discussions as more information received and clinical course evolves Continue emotional support of patient and family Palliative medicine will continue to follow  Symptom Management:  Per primary team PMT is available to assist as needed  Code Status: DNR  Prognosis: Unable to determine  Discharge Planning: To Be Determined  Discussed with: Patient family, medical team, nursing team  Thank you for allowing [place] to participate in the care of [name] PMT will continue to support holistically.  Time Total: 90 min  Visit consisted of counseling and education dealing with the complex and emotionally intense issues of symptom management and palliative care in the setting of serious and potentially life-threatening illness. Greater than 50%  of this time was spent counseling and coordinating care related to the above assessment and plan.  Walden Field, NP Palliative Medicine Team  Team Phone # [phone] (Nights/Weekends)  08/25/2021, 8:17 AM

## 2023-03-29 NOTE — Progress Notes (Signed)
LTM EEG D/C'd. No skin break down. [facility] notified.

## 2023-03-30 ENCOUNTER — Other Ambulatory Visit: Payer: Self-pay | Admitting: Radiation Therapy

## 2023-03-30 ENCOUNTER — Inpatient Hospital Stay (HOSPITAL_COMMUNITY): Payer: Medicare Other

## 2023-03-30 DIAGNOSIS — G40101 Localization-related (focal) (partial) symptomatic epilepsy and epileptic syndromes with simple partial seizures, not intractable, with status epilepticus: Secondary | ICD-10-CM | POA: Diagnosis not present

## 2023-03-30 DIAGNOSIS — N10 Acute tubulo-interstitial nephritis: Secondary | ICD-10-CM | POA: Diagnosis not present

## 2023-03-30 DIAGNOSIS — G9341 Metabolic encephalopathy: Secondary | ICD-10-CM | POA: Diagnosis not present

## 2023-03-30 DIAGNOSIS — C3490 Malignant neoplasm of unspecified part of unspecified bronchus or lung: Secondary | ICD-10-CM | POA: Diagnosis not present

## 2023-03-30 DIAGNOSIS — C7931 Secondary malignant neoplasm of brain: Secondary | ICD-10-CM | POA: Diagnosis not present

## 2023-03-30 LAB — MAGNESIUM: Magnesium: 2.2 mg/dL (ref 1.7–2.4)

## 2023-03-30 LAB — PHOSPHORUS: Phosphorus: 4.2 mg/dL (ref 2.5–4.6)

## 2023-03-30 LAB — GLUCOSE, CAPILLARY
Glucose-Capillary: 111 mg/dL — ABNORMAL HIGH (ref 70–99)
Glucose-Capillary: 115 mg/dL — ABNORMAL HIGH (ref 70–99)

## 2023-03-30 LAB — SURGICAL PATHOLOGY

## 2023-03-30 MED ORDER — FOLIC ACID 1 MG PO TABS
1.0000 mg | ORAL_TABLET | Freq: Every day | ORAL | Status: DC
  Administered 2023-03-31: 1 mg
  Filled 2023-03-30: qty 1

## 2023-03-30 MED ORDER — ACETAMINOPHEN 325 MG PO TABS
650.0000 mg | ORAL_TABLET | Freq: Four times a day (QID) | ORAL | Status: DC | PRN
  Administered 2023-03-30: 650 mg
  Filled 2023-03-30: qty 2

## 2023-03-30 MED ORDER — FREE WATER
100.0000 mL | Status: DC
  Administered 2023-03-30 – 2023-03-31 (×5): 100 mL

## 2023-03-30 MED ORDER — ONDANSETRON HCL 4 MG/2ML IJ SOLN: 4.0000 mg | Freq: Four times a day (QID) | INTRAMUSCULAR | Status: DC | PRN

## 2023-03-30 MED ORDER — OSMOLITE 1.5 CAL PO LIQD
1000.0000 mL | ORAL | Status: DC
  Administered 2023-03-30: 1000 mL

## 2023-03-30 MED ORDER — OLANZAPINE 5 MG PO TABS
7.5000 mg | ORAL_TABLET | Freq: Every day | ORAL | Status: DC
  Administered 2023-03-30: 7.5 mg
  Filled 2023-03-30: qty 1

## 2023-03-30 MED ORDER — ADULT MULTIVITAMIN W/MINERALS CH
1.0000 | ORAL_TABLET | Freq: Every day | ORAL | Status: DC
  Administered 2023-03-31: 1
  Filled 2023-03-30: qty 1

## 2023-03-30 MED ORDER — ONDANSETRON HCL 4 MG PO TABS: 4.0000 mg | ORAL_TABLET | Freq: Four times a day (QID) | ORAL | Status: DC | PRN

## 2023-03-30 MED ORDER — ACETAMINOPHEN 650 MG RE SUPP: 650.0000 mg | Freq: Four times a day (QID) | RECTAL | Status: DC | PRN

## 2023-03-30 MED ORDER — PROSOURCE TF20 ENFIT COMPATIBL EN LIQD
60.0000 mL | Freq: Every day | ENTERAL | Status: DC
  Administered 2023-03-30 – 2023-03-31 (×2): 60 mL
  Filled 2023-03-30 (×2): qty 60

## 2023-03-30 NOTE — Procedures (Signed)
Cortrak  Person Inserting Tube:  Randy Ray T, RD Tube Type:  Cortrak - 43 inches Tube Size:  10 Tube Location:  Left nare Secured by: Bridle Technique Used to Measure Tube Placement:  Marking at nare/corner of mouth Cortrak Secured At:  64 cm   Cortrak Tube Team Note:  Consult received to place a Cortrak feeding tube.   X-ray is required, abdominal x-ray has been ordered by the Cortrak team. Please confirm tube placement before using the Cortrak tube.   If the tube becomes dislodged please keep the tube and contact the Cortrak team at [email or web address] for replacement.  If after hours and replacement cannot be delayed, place a NG tube and confirm placement with an abdominal x-ray.    Samson Frederic RD, LDN For contact information, refer to [facility].

## 2023-03-30 NOTE — Progress Notes (Signed)
Palliative:  Call to patient's next of kin, sister Narda Rutherford. She is unable to be at the hospital today and asks if we can meet tomorrow. Offered to speak over the phone but she would prefer to have conversation in person. Plan to meet with her tomorrow, 4/4. She is unsure of timing - depends on transportation, will let me know tomorrow.  Juel Burrow, DNP, AGNP-C Palliative Medicine Team Team Phone # [phone]  Pager # [phone]  NO CHARGE

## 2023-03-30 NOTE — Evaluation (Signed)
Occupational Therapy Evaluation Patient Details Name: Randy Ray MRN: [id number] DOB: [birth year] Today's Date: 03/30/2023   History of Present Illness Mr. Dize is a 67 yo man presended with LUE tremors and L side weakness, ams/Confusion.  Imaging revealed hemorrhagic brain mets. Of note, recently admitted for: simple partial status epilepticus, lung cancer with mets to brain, acute metabolic cephalopathy & cocaine abuse. Seizures/simple parital status epilepticus. PMHx: COPD   Clinical Impression   Raysean was evaluated s/p the above admission list. He was not able to report PLOF or home set up. Upon evaluation he was limited by lethargy and restlessness, his eye remained closed the entire session, no commands following noted, he would spontaneously move R extremities but L extremities were flaccid throughout. Overall he requires total A +2 for all aspects of his care at bed level. Pt will benefit from continued acute OT services. Pt will benefit from skilled inpatient follow up therapy, <3 hours/day.       Recommendations for follow up therapy are one component of a multi-disciplinary discharge planning process, led by the attending physician.  Recommendations may be updated based on patient status, additional functional criteria and insurance authorization.   Assistance Recommended at Discharge Frequent or constant Supervision/Assistance  Patient can return home with the following A lot of help with walking and/or transfers;Two people to help with walking and/or transfers;A lot of help with bathing/dressing/bathroom;Assistance with cooking/housework;Two people to help with bathing/dressing/bathroom;Assistance with feeding;Direct supervision/assist for medications management;Direct supervision/assist for financial management;Assist for transportation;Help with stairs or ramp for entrance    Functional Status Assessment  Patient has not had a recent decline in their functional status   Equipment Recommendations  Other (comment) (pending)       Precautions / Restrictions Precautions Precautions: Fall Precaution Comments: bilateral wrist and posey belt restraint Restrictions Weight Bearing Restrictions: No      Mobility Bed Mobility Overal bed mobility: Needs Assistance Bed Mobility: Supine to Sit, Sit to Supine, Rolling Rolling: Total assist   Supine to sit: Total assist, +2 for physical assistance Sit to supine: Total assist, +2 for physical assistance        Transfers Overall transfer level: Needs assistance Equipment used: 2 person hand held assist Transfers: Sit to/from Stand Sit to Stand: Total assist, +2 physical assistance, From elevated surface           General transfer comment: partial stand with bilateral knee block. No noted activation of LE muscles to assist in standing effort      Balance Overall balance assessment: Needs assistance Sitting-balance support: No upper extremity supported, Feet unsupported Sitting balance-Leahy Scale: Zero Sitting balance - Comments: maxA, posterior lean, intermittent posterior pushing Postural control: Posterior lean Standing balance support: Bilateral upper extremity supported Standing balance-Leahy Scale: Zero Standing balance comment: totalA                           ADL either performed or assessed with clinical judgement   ADL Overall ADL's : Needs assistance/impaired                                       General ADL Comments: total A +2 for all apects     Vision   Additional Comments: difficult to assess, eyes closed entire session     Perception Perception Perception Tested?: No   Praxis Praxis Praxis tested?: Not tested  Pertinent Vitals/Pain Pain Assessment Pain Assessment: Faces Faces Pain Scale: No hurt Pain Intervention(s): Monitored during session     Hand Dominance     Extremity/Trunk Assessment Upper Extremity Assessment Upper  Extremity Assessment: LUE deficits/detail;RUE deficits/detail RUE Deficits / Details: spontaneous movements, unable to fully assess LUE Deficits / Details: flaccid, no AROM or activation noted LUE Sensation: decreased light touch;decreased proprioception LUE Coordination: decreased gross motor;decreased fine motor   Lower Extremity Assessment Lower Extremity Assessment: Defer to PT evaluation RLE Deficits / Details: pt spontaneously moves RLE, strength at least 4/5 based on observation. Pt does not follow commands for MMT LLE Deficits / Details: flaccid, no AROM noted. PROM WFL. no response to pain   Cervical / Trunk Assessment Cervical / Trunk Assessment: Normal   Communication Communication Communication: Receptive difficulties;Expressive difficulties   Cognition Arousal/Alertness: Awake/alert Behavior During Therapy: Impulsive, Restless Overall Cognitive Status: Difficult to assess                                 General Comments: pt appears to state his name, otherwise he is very difficult to understand. Pt does not follow any commands during session and appears to have limited awareness of deficits     General Comments  VSS on RA     Home Living Family/patient expects to be discharged to:: Unsure         Additional Comments: pt is unable to provide history at this time      Prior Functioning/Environment Prior Level of Function : Patient poor historian/Family not available             Mobility Comments: no information on prior level of function noted in chart, no family/caregivers present          OT Problem List: Decreased strength;Decreased range of motion;Decreased activity tolerance;Impaired balance (sitting and/or standing);Decreased knowledge of use of DME or AE;Decreased cognition;Decreased safety awareness;Decreased knowledge of precautions;Impaired UE functional use;Impaired sensation      OT Treatment/Interventions: Self-care/ADL  training;Therapeutic exercise;DME and/or AE instruction;Therapeutic activities;Patient/family education;Balance training    OT Goals(Current goals can be found in the care plan section) Acute Rehab OT Goals Patient Stated Goal: unable to state OT Goal Formulation: With patient Time For Goal Achievement: 04/13/23 Potential to Achieve Goals: Good ADL Goals Pt Will Perform Grooming: with mod assist;sitting Pt Will Perform Upper Body Dressing: with mod assist;sitting Pt Will Transfer to Toilet: with max assist;stand pivot transfer;bedside commode Additional ADL Goal #1: Pt will complete bed mobility with mod A as a precursor to ADLs Additional ADL Goal #2: Pt will sit EOB unsupported with min G for x10 minutes in preparation for ADLs  OT Frequency: Min 2X/week    Co-evaluation PT/OT/SLP Co-Evaluation/Treatment: Yes Reason for Co-Treatment: Complexity of the patient's impairments (multi-system involvement);Necessary to address cognition/behavior during functional activity;For patient/therapist safety;To address functional/ADL transfers PT goals addressed during session: Mobility/safety with mobility;Balance;Strengthening/ROM OT goals addressed during session: ADL's and self-care      AM-PAC OT "6 Clicks" Daily Activity     Outcome Measure Help from another person eating meals?: Total Help from another person taking care of personal grooming?: Total Help from another person toileting, which includes using toliet, bedpan, or urinal?: Total Help from another person bathing (including washing, rinsing, drying)?: Total Help from another person to put on and taking off regular upper body clothing?: Total Help from another person to put on and taking off regular lower body clothing?:  Total 6 Click Score: 6   End of Session Nurse Communication: Mobility status  Activity Tolerance: Patient limited by lethargy Patient left: in bed;with call bell/phone within reach;with bed alarm set;with  restraints reapplied  OT Visit Diagnosis: Unsteadiness on feet (R26.81);Other abnormalities of gait and mobility (R26.89);Muscle weakness (generalized) (M62.81);Hemiplegia and hemiparesis Hemiplegia - Right/Left: Left Hemiplegia - caused by: Unspecified                Time: [id number] OT Time Calculation (min): 20 min Charges:  OT General Charges $OT Visit: 1 Visit OT Evaluation $OT Eval Moderate Complexity: 1 Mod  Shade Flood, OTR/L [place] [facility] [place] Communication Preferred   Elliot Cousin 03/30/2023, 2:21 PM

## 2023-03-30 NOTE — Progress Notes (Signed)
Palliative:  PMT continues to follow. Per [place] discussion 4/2 family would like to await pathology results prior to making further decisions. As of this moment, not results yet. Will follow along and reach out to family when appropriate.   Juel Burrow, DNP, AGNP-C Palliative Medicine Team Team Phone # [phone]  Pager # [phone]  NO CHARGE

## 2023-03-30 NOTE — Progress Notes (Signed)
Subjective: Continues to be encephalopathic, required Precedex for agitation overnight.  ROS: Unable to obtain due to poor mental status  Examination  Vital signs in last 24 hours: Temp:  [95.8 F (35.4 C)-99 F (37.2 C)] 96.6 F (35.9 C) (04/03 1158) Pulse Rate:  [44-106] 51 (04/03 1200) Resp:  [12-28] 21 (04/03 1200) BP: (126-186)/(53-126) 141/83 (04/03 1200) SpO2:  [98 %-100 %] 98 % (04/03 1200)  General: lying in bed, not in apparent distress Neuro: Barely opens eyes to noxious stimuli, did not answer any orientation questions, squeezed my hand with the right hand but did not follow any other commands, PERRLA, no forced gaze deviation,withdraws noxious stimuli in all extremities with left hemiparesis   Basic Metabolic Panel: Recent Labs  Lab 03/26/23 1016 03/27/23 0824 03/29/23 0923  NA 140 136 140  K 4.1 4.2 3.8  CL 107 103 105  CO2 24 22 23   GLUCOSE 104* 123* 102*  BUN 14 11 15   CREATININE 1.00 0.85 0.85  CALCIUM 9.6 9.7 9.5    CBC: Recent Labs  Lab 03/26/23 1016 03/27/23 0824 03/29/23 0923  WBC 5.9 3.5* 3.1*  NEUTROABS 4.0  --   --   HGB 12.0* 12.4* 12.2*  HCT 37.6* 37.5* 35.9*  MCV 87.9 84.8 83.9  PLT 303 310 305     Coagulation Studies: Recent Labs    03/28/23 0540  LABPROT 14.3  INR 1.1    Imaging No new brain imaging  ASSESSMENT AND PLAN: 67 year old male presented with focal convulsive status epilepticus (left upper extremity jerking) which has since resolved.   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  Can gradually taper once more awake. 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  Palliative care team spoke with family yesterday and wanted to wait on the biopsy results before further goals of care decisions.  Will check with palliative care and ICU regarding further goals of care -If family wants to continue to pursue treatment, will may need repeat EEG and adjusting antiseizure medications because of persistent confusion. -Discussed plan with Dr. Tacy Learn (ICU) and palliative care team via secure chat   I have spent a total of  35 minutes with the patient reviewing hospital notes,  test results, labs and examining the patient.  > 50% of time was spent in direct patient care.   Zeb Comfort Epilepsy [facility] For questions after 5pm please refer to AMION to reach the Neurologist on call

## 2023-03-30 NOTE — Progress Notes (Signed)
NAME:  Randy Ray, MRN:  [id number], DOB:  July 20, 1956, LOS: 4 ADMISSION DATE:  03/26/2023, CONSULTATION DATE:  03/28/23 REFERRING MD:  [place] Crane , CHIEF COMPLAINT:  Agitation   History of Present Illness:  Randy Ray is a 67 yo man with hx of COPD, presended with LUE tremors and L side weakness, ams/Confusion on presentation to ED.  Imaging revealed hemorrhagic brain mets. Seizures/simple parital status epilepticus.  Lung cancer - apparent bronchogenic carcinoma with lad and hemorrhagic brain mets.   Was started on Decadron.  Keppra, vimpat. Depakote  Plan for onc and pall care consults.  UTI also had been noted and started on CTX ( though I do not see record of this on charting, I am starting it now)  IR consulted, planned for  Guided bx of L scapular lesion tomorrow 03/28/23  Earlier today was alert, but disoriented and at times confused.  More agitated per note at 740pm.   Given ativan 2mg , haldol, no improvement.     MRI brain: IMPRESSION: Multiple intracranial contrast-enhancing lesions (5 lesions), compatible with metastatic disease. The largest lesion is in the left parietal lobe measuring up to 2.1 cm. Dural-based lesion along the posterior right frontal convexity demonstrates layering blood products.  CT chest abd pelvis: IMPRESSION: 2. 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 3. Isolated osseous metastasis within the left scapula. 5. 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 Emphysema   Pertinent  Medical History  Former smoker, quit 2 y ago.  COPD COVID hx  Home meds: albuterol, vit C, claritin, naproxen, anoro ellipta, zinc  Significant Hospital Events: Including procedures, antibiotic start and stop dates in addition to other pertinent events     Interim History / Subjective:  Patient required Precedex overnight for agitation  He was bradycardic with heart rate in mid 40s  Objective   Blood pressure  (!) 157/87, pulse (!) 54, temperature (!) 97.5 F (36.4 C), temperature source Axillary, resp. rate 18, height 5' 9.6" (1.768 m), weight 90.1 kg, SpO2 99 %.        Intake/Output Summary (Last 24 hours) at 03/30/2023 0756 Last data filed at 03/30/2023 0700 Gross per 24 hour  Intake 2884.54 ml  Output 2200 ml  Net 684.54 ml   Filed Weights   03/27/23 0055  Weight: 90.1 kg    Examination: Physical exam: General: Acute on chronically ill-appearing male, lying on the bed HEENT: [place]/AT, eyes anicteric.  Dry mucus membranes Neuro: Eyes closed, does not open, not following commands, pupils pinpoint, withdrawing to painful stimuli on the right left side is weaker Chest: Coarse breath sounds, no wheezes or rhonchi Heart: Bradycardic, no murmurs or gallops Abdomen: Soft, nontender, nondistended, bowel sounds present Skin: No rash  Labs and images were reviewed  Resolved Hospital Problem list     Assessment & Plan:  Acute hyperactive delirium with agitation likely in the setting of hemorrhagic brain mets  Alcohol dependence, now with alcohol withdrawal Polysubstance abuse Decrease Precedex max dose dose to 0.6 Continue phenobarbital which will help with alcohol withdrawal and substance withdrawal Continue CIWA protocol Continue Zyprexa 7.5 mg at bedtime Continue thiamine and folate Continue IV fluid  Complex partial status epilepticus Patient presented with altered mental status His mental status has not improved EEG confirmed status epilepticus, now no more seizures but high risk of recurrence Neurology is following appreciate their recommendation Continue Keppra, Vimpat, valproic acid Continue seizure and fall precautions  Acute encephalopathy, combination of toxic with polysubstance  and postictal Patient ammonia level was elevated to 39 Depakote dose was decreased Will repeat ammonia level tomorrow  Metastatic lung cancer to brain Patient was noted to have multiple  hemorrhagic metastatic brain lesions CT chest abdomen pelvis showing likely bronchogenic carcinoma Biopsy was done, path report is pending Palliative care is following  Acute urinary tract infection with E. coli Continue IV ceftriaxone Urine culture showed E. coli which is pansensitive  COPD, not in exacerbation Continue nebs  Tobacco dependence Continue nicotine patch to prevent continued salt  Best Practice (right click and "Reselect all SmartList Selections" daily)   Diet/type: NPO start tube feeds today DVT prophylaxis: SCD GI prophylaxis: N/A Lines: N/A Foley:  N/A Code Status: DNR Last date of multidisciplinary goals of care discussion [4/2: Please see palliative care note]  Labs   CBC: Recent Labs  Lab 03/26/23 1016 03/27/23 0824 03/29/23 0923  WBC 5.9 3.5* 3.1*  NEUTROABS 4.0  --   --   HGB 12.0* 12.4* 12.2*  HCT 37.6* 37.5* 35.9*  MCV 87.9 84.8 83.9  PLT 303 310 [id number]    Basic Metabolic Panel: Recent Labs  Lab 03/26/23 1016 03/27/23 0824 03/29/23 0923  NA 140 136 140  K 4.1 4.2 3.8  CL 107 103 105  CO2 24 22 23   GLUCOSE 104* 123* 102*  BUN 14 11 15   CREATININE 1.00 0.85 0.85  CALCIUM 9.6 9.7 9.5   GFR: Estimated Creatinine Clearance: 97.2 mL/min (by C-G formula based on SCr of 0.85 mg/dL). Recent Labs  Lab 03/26/23 1016 03/27/23 0824 03/29/23 0923  WBC 5.9 3.5* 3.1*  LATICACIDVEN 1.5  --   --     Liver Function Tests: Recent Labs  Lab 03/26/23 1016  AST 17  ALT 10  ALKPHOS 57  BILITOT 0.6  PROT 7.9  ALBUMIN 3.8   No results for input(s): "LIPASE", "AMYLASE" in the last 168 hours. Recent Labs  Lab 03/28/23 1459  AMMONIA 39*    ABG    Component Value Date/Time   PHART 7.35 02/19/2022 1536   PCO2ART 34 02/19/2022 1536   PO2ART 44 (L) 02/19/2022 1536   HCO3 18.8 (L) 02/19/2022 1536   ACIDBASEDEF 6.0 (H) 02/19/2022 1536   O2SAT 96.5 02/20/2022 0625     Coagulation Profile: Recent Labs  Lab 03/28/23 0540  INR 1.1     Cardiac Enzymes: No results for input(s): "CKTOTAL", "CKMB", "CKMBINDEX", "TROPONINI" in the last 168 hours.  HbA1C: No results found for: "HGBA1C"  CBG: No results for input(s): "GLUCAP" in the last 168 hours.  This patient is critically ill with multiple organ system failure which requires frequent high complexity decision making, assessment, support, evaluation, and titration of therapies. This was completed through the application of advanced monitoring technologies and extensive interpretation of multiple databases.  During this encounter critical care time was devoted to patient care services described in this note for 34 minutes.    Jacky Kindle, MD [facility] See Amion for pager If no response to pager, please call [phone] until 7pm After 7pm, Please call E-link [phone]

## 2023-03-30 NOTE — Progress Notes (Signed)
SLP Cancellation Note  Patient Details Name: Randy Ray MRN: [id number] DOB: 12/31/1955   Cancelled treatment:   Pt not alert enough for participation in swallow eval; has had Ativan. Will follow for readiness.  Harshan Kearley L. Tivis Ringer, MA CCC/SLP Clinical Specialist - Acute Care SLP Acute Rehabilitation Services Office number [phone]         [name] 03/30/2023, 9:41 AM

## 2023-03-30 NOTE — Progress Notes (Addendum)
Initial Nutrition Assessment  DOCUMENTATION CODES:   Not applicable  INTERVENTION:  - Initiate Osmolite 1.5 @ 15 mL/hr and advance by 10 mL q8H to goal rate of 65 mL/hr (1560 mL) + TF20 PS q day.  - This will provide 2420 kcals, 117 gm protein, 1188 mL free water.  - IVF have been stopped; FWF 100 mL Q4H (600 mL)   NUTRITION DIAGNOSIS:   Inadequate oral intake related to lethargy/confusion, inability to eat as evidenced by meal completion < 25%.  GOAL:   Provide needs based on ASPEN/SCCM guidelines, Patient will meet greater than or equal to 90% of their needs  MONITOR:   PO intake, TF tolerance  REASON FOR ASSESSMENT:   Consult Enteral/tube feeding initiation and management  ASSESSMENT:   67 y.o. male admits related to LUE tremors and left sided weakness as well as confusion. PMH includes: COPD. Pt is currently receiving medical management related to acute hyperactive delirium with agitation likely in the setting of hemorrhagic brain mets.  Meds reviewed:  decadron, folic acid, MVI, thiamine. Labs reviewed: WDL.   Pt is not currently able to provide nutrition-related hx due to orientation level. No significant wt loss per record. Consult was placed for Cortrak placement today. RD will initiate EN of Osmolite 1.5 with slow advancement to goal rate. RD will continue to monitor EN tolerance. Pt has Regular diet ordered but with no PO intakes.   NUTRITION - FOCUSED PHYSICAL EXAM:  RD charting remotely attempt NFPE at follow up.  Diet Order:   Diet Order             Diet regular Room service appropriate? Yes; Fluid consistency: Thin  Diet effective now                   EDUCATION NEEDS:   Not appropriate for education at this time  Skin:  Skin Assessment: Reviewed RN Assessment  Last BM:  PTA  Height:   Ht Readings from Last 1 Encounters:  03/27/23 5' 9.6" (1.768 m)    Weight:   Wt Readings from Last 1 Encounters:  03/27/23 90.1 kg    Ideal Body  Weight:     BMI:  Body mass index is 28.83 kg/m.  Estimated Nutritional Needs:   Kcal:  2250-2700 kcals  Protein:  110-135 gm  Fluid:  >/= 2.2 L  Thalia Bloodgood, RD, LDN, CNSC.

## 2023-03-30 NOTE — Evaluation (Signed)
Physical Therapy Evaluation Patient Details Name: Randy Ray MRN: [id number] DOB: 08/15/1956 Today's Date: 03/30/2023  History of Present Illness  Randy Ray is a 67 yo man presended with LUE tremors and L side weakness, ams/Confusion.  Imaging revealed hemorrhagic brain mets. Of note, recently admitted for: simple partial status epilepticus, lung cancer with mets to brain, acute metabolic cephalopathy & cocaine abuse. Seizures/simple parital status epilepticus. PMHx: COPD  Clinical Impression  Pt presents to PT with deficits in cognition, functional mobility, awareness, balance. Pt does not follow commands during session. Pt is restless on PT arrival, fidgeting within allowance of wrist restraints and posey belt. Pt does not open his eyes during session, and requires totalA for all functional mobility at this time. PT notes no active movement of left side during session, with no response to pain either. Pt will benefit from further PT assessment in an effort to improve mobility quality. If pt remains unable to follow commands PT may sign off.       Recommendations for follow up therapy are one component of a multi-disciplinary discharge planning process, led by the attending physician.  Recommendations may be updated based on patient status, additional functional criteria and insurance authorization.  Follow Up Recommendations Can patient physically be transported by private vehicle: No     Assistance Recommended at Discharge Frequent or constant Supervision/Assistance  Patient can return home with the following  Two people to help with walking and/or transfers;Two people to help with bathing/dressing/bathroom;Assistance with cooking/housework;Assistance with feeding;Direct supervision/assist for medications management;Direct supervision/assist for financial management;Help with stairs or ramp for entrance;Assist for transportation    Equipment Recommendations Hospital bed;Wheelchair  (measurements PT) (hoyer lift)  Recommendations for Other Services       Functional Status Assessment Patient has had a recent decline in their functional status and demonstrates the ability to make significant improvements in function in a reasonable and predictable amount of time.     Precautions / Restrictions Precautions Precautions: Fall Precaution Comments: bilateral wrist and posey belt restraint Restrictions Weight Bearing Restrictions: No      Mobility  Bed Mobility Overal bed mobility: Needs Assistance Bed Mobility: Supine to Sit, Sit to Supine, Rolling Rolling: Total assist   Supine to sit: Total assist, +2 for physical assistance Sit to supine: Total assist, +2 for physical assistance        Transfers Overall transfer level: Needs assistance Equipment used: 2 person hand held assist Transfers: Sit to/from Stand Sit to Stand: Total assist, +2 physical assistance, From elevated surface           General transfer comment: partial stand with bilateral knee block. No noted activation of LE muscles to assist in standing effort    Ambulation/Gait                  Stairs            Wheelchair Mobility    Modified Rankin (Stroke Patients Only)       Balance Overall balance assessment: Needs assistance Sitting-balance support: No upper extremity supported, Feet unsupported Sitting balance-Leahy Scale: Zero Sitting balance - Comments: maxA, posterior lean, intermittent posterior pushing Postural control: Posterior lean Standing balance support: Bilateral upper extremity supported Standing balance-Leahy Scale: Zero Standing balance comment: totalA                             Pertinent Vitals/Pain Pain Assessment Pain Assessment: CPOT Facial Expression: Relaxed, neutral Body Movements:  Restlessness Muscle Tension: Relaxed Compliance with ventilator (intubated pts.): N/A Vocalization (extubated pts.): Talking in normal tone  or no sound CPOT Total: 2 Pain Intervention(s): Monitored during session    Home Living Family/patient expects to be discharged to:: Unsure                   Additional Comments: pt is unable to provide history at this time    Prior Function Prior Level of Function : Patient poor historian/Family not available             Mobility Comments: no information on prior level of function noted in chart, no family/caregivers present       Hand Dominance        Extremity/Trunk Assessment   Upper Extremity Assessment Upper Extremity Assessment: Defer to OT evaluation    Lower Extremity Assessment Lower Extremity Assessment: LLE deficits/detail;RLE deficits/detail RLE Deficits / Details: pt spontaneously moves RLE, strength at least 4/5 based on observation. Pt does not follow commands for MMT LLE Deficits / Details: flaccid, no AROM noted. PROM WFL. no response to pain    Cervical / Trunk Assessment Cervical / Trunk Assessment: Normal  Communication   Communication: Receptive difficulties;Expressive difficulties  Cognition Arousal/Alertness: Awake/alert (pt is restless on arrival but does not open eyes during session) Behavior During Therapy: Impulsive, Restless Overall Cognitive Status: Difficult to assess                                 General Comments: pt appears to state his name, otherwise he is very difficult to understand. Pt does not follow any commands during session and appears to have limited awareness of deficits        General Comments General comments (skin integrity, edema, etc.): VSS on RA    Exercises     Assessment/Plan    PT Assessment Patient needs continued PT services  PT Problem List Decreased strength;Decreased activity tolerance;Decreased balance;Decreased mobility;Decreased knowledge of use of DME;Decreased safety awareness;Decreased knowledge of precautions;Decreased cognition;Decreased coordination       PT  Treatment Interventions DME instruction;Gait training;Functional mobility training;Therapeutic activities;Therapeutic exercise;Balance training;Neuromuscular re-education;Cognitive remediation;Patient/family education;Wheelchair mobility training    PT Goals (Current goals can be found in the Care Plan section)  Acute Rehab PT Goals Patient Stated Goal: patient is unable to state, PT goal to improve ability to follow commands and improve sitting balance PT Goal Formulation: Patient unable to participate in goal setting Time For Goal Achievement: 04/13/23 Potential to Achieve Goals: Poor Additional Goals Additional Goal #1: Pt will follow >50% of verbal commands to demonstrate improved participation    Frequency Min 2X/week     Co-evaluation PT/OT/SLP Co-Evaluation/Treatment: Yes Reason for Co-Treatment: Complexity of the patient's impairments (multi-system involvement);Necessary to address cognition/behavior during functional activity;For patient/therapist safety;To address functional/ADL transfers PT goals addressed during session: Mobility/safety with mobility;Balance;Strengthening/ROM OT goals addressed during session: ADL's and self-care       AM-PAC PT "6 Clicks" Mobility  Outcome Measure Help needed turning from your back to your side while in a flat bed without using bedrails?: Total Help needed moving from lying on your back to sitting on the side of a flat bed without using bedrails?: Total Help needed moving to and from a bed to a chair (including a wheelchair)?: Total Help needed standing up from a chair using your arms (e.g., wheelchair or bedside chair)?: Total Help needed to walk in hospital room?: Total  Help needed climbing 3-5 steps with a railing? : Total 6 Click Score: 6    End of Session   Activity Tolerance: Other (comment) (limited by impaired cogntion) Patient left: in bed;with call bell/phone within reach;with bed alarm set;with restraints reapplied Nurse  Communication: Mobility status;Need for lift equipment PT Visit Diagnosis: Other abnormalities of gait and mobility (R26.89);Other symptoms and signs involving the nervous system (R29.898);Hemiplegia and hemiparesis Hemiplegia - Right/Left: Left Hemiplegia - caused by: Other cerebrovascular disease    Time: [id number] PT Time Calculation (min) (ACUTE ONLY): 17 min   Charges:   PT Evaluation $PT Eval Moderate Complexity: 1 Mod          Zenaida Niece, PT, DPT Acute Rehabilitation Office [phone]   Zenaida Niece 03/30/2023, 1:19 PM

## 2023-03-31 DIAGNOSIS — C3490 Malignant neoplasm of unspecified part of unspecified bronchus or lung: Secondary | ICD-10-CM | POA: Diagnosis not present

## 2023-03-31 DIAGNOSIS — J449 Chronic obstructive pulmonary disease, unspecified: Secondary | ICD-10-CM | POA: Diagnosis not present

## 2023-03-31 DIAGNOSIS — C7931 Secondary malignant neoplasm of brain: Secondary | ICD-10-CM | POA: Diagnosis not present

## 2023-03-31 DIAGNOSIS — G40101 Localization-related (focal) (partial) symptomatic epilepsy and epileptic syndromes with simple partial seizures, not intractable, with status epilepticus: Secondary | ICD-10-CM | POA: Diagnosis not present

## 2023-03-31 DIAGNOSIS — G9341 Metabolic encephalopathy: Secondary | ICD-10-CM | POA: Diagnosis not present

## 2023-03-31 DIAGNOSIS — F1410 Cocaine abuse, uncomplicated: Secondary | ICD-10-CM | POA: Diagnosis not present

## 2023-03-31 LAB — GLUCOSE, CAPILLARY
Glucose-Capillary: 118 mg/dL — ABNORMAL HIGH (ref 70–99)
Glucose-Capillary: 119 mg/dL — ABNORMAL HIGH (ref 70–99)
Glucose-Capillary: 116 mg/dL — ABNORMAL HIGH (ref 70–99)

## 2023-03-31 LAB — AMMONIA: Ammonia: 43 umol/L — ABNORMAL HIGH (ref 9–35)

## 2023-03-31 LAB — PHOSPHORUS: Phosphorus: 4.7 mg/dL — ABNORMAL HIGH (ref 2.5–4.6)

## 2023-03-31 LAB — MAGNESIUM: Magnesium: 2.2 mg/dL (ref 1.7–2.4)

## 2023-03-31 MED ORDER — ACETAMINOPHEN 325 MG PO TABS: 650.0000 mg | ORAL_TABLET | Freq: Four times a day (QID) | ORAL | Status: DC | PRN

## 2023-03-31 MED ORDER — POLYVINYL ALCOHOL 1.4 % OP SOLN: 1.0000 [drp] | Freq: Four times a day (QID) | OPHTHALMIC | Status: DC | PRN

## 2023-03-31 MED ORDER — ORAL CARE MOUTH RINSE
15.0000 mL | OROMUCOSAL | Status: DC
  Administered 2023-03-31 – 2023-04-04 (×20): 15 mL via OROMUCOSAL

## 2023-03-31 MED ORDER — GLYCOPYRROLATE 0.2 MG/ML IJ SOLN
0.2000 mg | INTRAMUSCULAR | Status: DC | PRN
  Filled 2023-03-31: qty 1

## 2023-03-31 MED ORDER — HYDROMORPHONE HCL 1 MG/ML IJ SOLN: 0.5000 mg | INTRAMUSCULAR | Status: DC | PRN

## 2023-03-31 MED ORDER — GLYCOPYRROLATE 1 MG PO TABS: 1.0000 mg | ORAL_TABLET | ORAL | Status: DC | PRN

## 2023-03-31 MED ORDER — ACETAMINOPHEN 650 MG RE SUPP: 650.0000 mg | Freq: Four times a day (QID) | RECTAL | Status: DC | PRN

## 2023-03-31 MED ORDER — HYDROMORPHONE HCL-NACL 50-0.9 MG/50ML-% IV SOLN
0.5000 mg/h | INTRAVENOUS | Status: DC
  Administered 2023-03-31 – 2023-04-03 (×2): 0.5 mg/h via INTRAVENOUS
  Filled 2023-03-31 (×2): qty 50

## 2023-03-31 MED ORDER — ORAL CARE MOUTH RINSE: 15.0000 mL | OROMUCOSAL | Status: DC | PRN

## 2023-03-31 MED ORDER — THIAMINE MONONITRATE 100 MG PO TABS
100.0000 mg | ORAL_TABLET | Freq: Every day | ORAL | Status: DC
  Administered 2023-03-31: 100 mg
  Filled 2023-03-31: qty 1

## 2023-03-31 MED ORDER — MORPHINE SULFATE (PF) 2 MG/ML IV SOLN: 2.0000 mg | INTRAVENOUS | Status: DC | PRN

## 2023-03-31 MED ORDER — GLYCOPYRROLATE 0.2 MG/ML IJ SOLN
0.2000 mg | INTRAMUSCULAR | Status: DC | PRN
  Administered 2023-03-31 – 2023-04-01 (×4): 0.2 mg via INTRAVENOUS
  Filled 2023-03-31 (×3): qty 1

## 2023-03-31 NOTE — Progress Notes (Signed)
[facility]) Hospice hospital liaison note  Received request from [facility] for family interest in hospice home.   Chart reviewed and hospice home eligibility is confirmed at this time. Visited at bedside and talked with sister by phone.    Unfortunately hospice home does not have a bed to offer today. TOC is aware hospital liaison will follow up tomorrow or sooner if room becomes available.   Please do not hesitate to call with any hospice related questions or concerns.   Thank you for the opportunity to participate in this patient's care.  Jhonnie Garner, Therapist, sports, [facility] Liaison  [phone]

## 2023-03-31 NOTE — Progress Notes (Signed)
SLP Cancellation Note  Patient Details Name: Randy Ray MRN: [id number] DOB: September 05, 1956   Cancelled treatment:       Reason Eval/Treat Not Completed: Medical issues which prohibited therapy  Per RN, family meeting was held this morning and decision was made for comfort care. Will sign off.    Osie Bond., M.A. [place] Office [phone]  Secure chat preferred  03/31/2023, 12:34 PM

## 2023-03-31 NOTE — Progress Notes (Signed)
NAME:  Randy Ray, MRN:  [id number], DOB:  10-01-1956, LOS: 5 ADMISSION DATE:  03/26/2023, CONSULTATION DATE:  03/28/23 REFERRING MD:  Nevada Crane , CHIEF COMPLAINT:  Agitation   History of Present Illness:  Mr. Randy Ray is a 67 yo man with hx of COPD, presended with LUE tremors and L side weakness, ams/Confusion on presentation to ED.  Imaging revealed hemorrhagic brain mets. Seizures/simple parital status epilepticus.  Lung cancer - apparent bronchogenic carcinoma with lad and hemorrhagic brain mets.   Was started on Decadron.  Keppra, vimpat. Depakote  Plan for onc and pall care consults.  UTI also had been noted and started on CTX ( though I do not see record of this on charting, I am starting it now)  IR consulted, planned for  Guided bx of L scapular lesion tomorrow 03/28/23  Earlier today was alert, but disoriented and at times confused.  More agitated per note at 740pm.   Given ativan 2mg , haldol, no improvement.     MRI brain: IMPRESSION: Multiple intracranial contrast-enhancing lesions (5 lesions), compatible with metastatic disease. The largest lesion is in the left parietal lobe measuring up to 2.1 cm. Dural-based lesion along the posterior right frontal convexity demonstrates layering blood products.  CT chest abd pelvis: IMPRESSION: 2. 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 3. Isolated osseous metastasis within the left scapula. 5. 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 Emphysema   Pertinent  Medical History  Former smoker, quit 2 y ago.  COPD COVID hx  Home meds: albuterol, vit C, claritin, naproxen, anoro ellipta, zinc  Significant Hospital Events: Including procedures, antibiotic start and stop dates in addition to other pertinent events     Interim History / Subjective:  Patient remained confused and encephalopathic Still requiring Precedex infusion Became bradycardic so Precedex dose was  decreased to 0.2 mics per KG   Objective   Blood pressure 90/60, pulse (!) 59, temperature 98.8 F (37.1 C), temperature source Axillary, resp. rate 10, height 5' 9.6" (1.768 m), weight 90.1 kg, SpO2 95 %.        Intake/Output Summary (Last 24 hours) at 03/31/2023 0805 Last data filed at 03/31/2023 0700 Gross per 24 hour  Intake 2210.1 ml  Output 2125 ml  Net 85.1 ml   Filed Weights   03/27/23 0055  Weight: 90.1 kg    Examination: Physical exam: General: Acute on chronically ill-appearing male, lying on the bed HEENT: [place]/AT, eyes anicteric.  Dry mucus membranes.  Poor dentition Neuro: Opens eyes with painful stimuli, not following commands, moaning in pain.  Absent cough and gag Chest: Coarse breath sounds, no wheezes or rhonchi Heart: Regular rate and rhythm, no murmurs or gallops Abdomen: Soft, nontender, nondistended, bowel sounds present Skin: No rash  Labs and images were reviewed Ammonia is going up now at 64 Pathology report showed poorly differentiated metastatic squamous cell [facility] Problem list     Assessment & Plan:  Acute hyperactive delirium with agitation likely in the setting of hemorrhagic brain mets  Alcohol dependence, now with alcohol withdrawal Polysubstance abuse Patient became bradycardic overnight with Precedex, will stop Precedex today Continue phenobarbital which will help with alcohol withdrawal and substance withdrawal Continue CIWA protocol Continue Zyprexa 7.5 mg at bedtime Continue thiamine and folate Discontinue IV fluid  Complex partial status epilepticus Patient presented with altered mental status Patient mental status remain poor, he is encephalopathic and moaning in pain EEG confirmed status epilepticus, now no more  seizures but high risk of recurrence Neurology is following appreciate their recommendation Continue Keppra, Vimpat, valproic acid Continue seizure and fall precautions  Acute encephalopathy,  combination of toxic with polysubstance and postictal Patient ammonia level was elevated to 43, I do not think that is contributing to his change in mental status Continue Depakote Watch for signs of withdrawal from substances  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 coli Continue IV ceftriaxone to complete 5 days therapy Urine culture showed E. coli which is pansensitive  COPD, not in exacerbation Continue nebs  Tobacco dependence Continue nicotine patch to prevent continued salt  Best Practice (right click and "Reselect all SmartList Selections" daily)   Diet/type: NPO tube feeds DVT prophylaxis: SCD GI prophylaxis: N/A Lines: N/A Foley:  N/A Code Status: DNR Last date of multidisciplinary goals of care discussion [4/2: Please see palliative care note]  Labs   CBC: Recent Labs  Lab 03/26/23 1016 03/27/23 0824 03/29/23 0923  WBC 5.9 3.5* 3.1*  NEUTROABS 4.0  --   --   HGB 12.0* 12.4* 12.2*  HCT 37.6* 37.5* 35.9*  MCV 87.9 84.8 83.9  PLT 303 [id number]    Basic Metabolic Panel: Recent Labs  Lab 03/26/23 1016 03/27/23 0824 03/29/23 0923 03/30/23 1605 03/31/23 0614  NA 140 136 140  --   --   K 4.1 4.2 3.8  --   --   CL 107 103 105  --   --   CO2 24 22 23   --   --   GLUCOSE 104* 123* 102*  --   --   BUN 14 11 15   --   --   CREATININE 1.00 0.85 0.85  --   --   CALCIUM 9.6 9.7 9.5  --   --   MG  --   --   --  2.2 2.2  PHOS  --   --   --  4.2 4.7*   GFR: Estimated Creatinine Clearance: 97.2 mL/min (by C-G formula based on SCr of 0.85 mg/dL). Recent Labs  Lab 03/26/23 1016 03/27/23 0824  03/29/23 0923  WBC 5.9 3.5* 3.1*  LATICACIDVEN 1.5  --   --     Liver Function Tests: Recent Labs  Lab 03/26/23 1016  AST 17  ALT 10  ALKPHOS 57  BILITOT 0.6  PROT 7.9  ALBUMIN 3.8   No results for input(s): "LIPASE", "AMYLASE" in the last 168 hours. Recent Labs  Lab 03/28/23 1459 03/31/23 0614  AMMONIA 39* 43*    ABG    Component Value Date/Time   PHART 7.35 02/19/2022 1536   PCO2ART 34 02/19/2022 1536   PO2ART 44 (L) 02/19/2022 1536   HCO3 18.8 (L) 02/19/2022 1536   ACIDBASEDEF 6.0 (H) 02/19/2022 1536   O2SAT 96.5 02/20/2022 0625     Coagulation Profile: Recent Labs  Lab 03/28/23 0540  INR 1.1    Cardiac Enzymes: No results for input(s): "CKTOTAL", "CKMB", "CKMBINDEX", "TROPONINI" in the last 168 hours.  HbA1C: No results found for: "HGBA1C"  CBG: Recent Labs  Lab 03/30/23 1925 03/30/23 2318 03/31/23 0317  GLUCAP 111* 115* 118*    This patient is critically ill with multiple organ system  failure which requires frequent high complexity decision making, assessment, support, evaluation, and titration of therapies. This was completed through the application of advanced monitoring technologies and extensive interpretation of multiple databases.  During this encounter critical care time was devoted to patient care services described in this note for 33 minutes.    Jacky Kindle, MD [facility] See Amion for pager If no response to pager, please call [phone] until 7pm After 7pm, Please call E-link [phone]

## 2023-03-31 NOTE — Progress Notes (Signed)
Nutrition Brief Note  Chart reviewed. Pt now transitioning to comfort care.  Per MD plan to keep cortrak tube in place for now and d/c when pt leaves for hospice house. Osmolite 1.5 currently infusing @ 15 ml/hr  Please re-consult as needed.   Lockie Pares., RD, LDN, CNSC See AMiON for contact information

## 2023-03-31 NOTE — Progress Notes (Signed)
Palliative Care Progress Note, Assessment & Plan   Patient Name: Randy Ray       Date: 03/31/2023 DOB: 1956-12-23  Age: 67 y.o. MRN#: [id number] Attending Physician: Jacky Kindle, MD Primary Care Physician: Pcp, No Admit Date: 03/26/2023  Subjective: Patient is lying in bed in no apparent distress.  His respirations are even and unlabored.  Core track in place.  Patient did not respond to verbal stimuli or light touch.  Patient's brother, sister, and niece are at bedside.  HPI: 67 y.o. male  with past medical history of COPD and tobacco abuse who presented to the emergency room on 3/30 complaining of left upper extremity tremors and left-sided weakness as well as confusion. He was admitted on 03/26/2023 with simple partial status epilepticus, lung cancer with mets to brain (newly diagnosed), and acute metabolic encephalopathy.  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  Summary of counseling/coordination of care: After reviewing the patient's chart and assessing the patient at bedside, attending CCM Dr. Tacy Learn and I met with family and patient at bedside to discuss biopsy results and plan of care.  Dr. Tacy Learn conveyed results of biopsy and poor prognosis.  Therapeutic silence and active listening provided for family to share their thoughts and emotions regarding current medical situation.  Emotional support provided.  I attempted to elicit values and goals important to patient and family.  Patient's sister, brother, and niece are in agreement to move forward with comfort measures.  They would like for patient to be evaluated for inpatient hospice placement.  They do not want to do any more testing or procedures as they think this would be undue harm.  They do not  want patient to suffer or experience pain.  Family is concerned that patient will become agitated since he is in the hospital.  We discussed aggressively managing patient's symptoms, including agitation, anxiety, pain, and restlessness.  Family is in full support to appropriately manage patient's agitation to ensure his he is not suffering as well as to maintain his safety.  DNR remains.  Family does not want to remove core track at this time.  We discussed that core track and artificial nutrition/hydration are not compatible with keeping patient comfortable at end-of-life.  Reviewed that we are not starving the patient but rather liberating him from artificial treatments.  Hard choices for loving people book given for family to review.  RN made aware of shift to comfort measures with leaving core track in place.  Patient appears comfortable at this time.  No adjustments to medications needed.  RN made aware that if further pushes of Dilaudid are needed that patient is appropriate for Dilaudid gtt.  TOC made aware of family's request for inpatient evaluation.  Specifically, family has requested hospice home in [place] as patient has several family members that live in this area.  PMT will continue to monitor and support patient and family throughout his hospitalization.  Physical Exam Vitals reviewed.  Constitutional:      General: He is not in acute distress.    Appearance: He is ill-appearing.  HENT:     Head: Normocephalic.     Nose: Nose normal.  Mouth/Throat:     Mouth: Mucous membranes are moist.  Cardiovascular:     Rate and Rhythm: Tachycardia present.  Abdominal:     Palpations: Abdomen is soft.  Musculoskeletal:     Comments: Generalized weakness             Total Time 50 minutes   Randy Ray L. Ilsa Iha, FNP-BC Palliative Medicine Team Team Phone # [phone]

## 2023-03-31 NOTE — Progress Notes (Signed)
1 mg ativan and 0.5 dilaudid wasted with Rufina Falco

## 2023-03-31 NOTE — Progress Notes (Signed)
Subjective: Continues to be encephalopathic. Had family meeting today with sister, brother and niece. Family opted for hospice.  ROS: Unable to obtain due to poor mental status  Examination  Vital signs in last 24 hours: Temp:  [96.5 F (35.8 C)-98.8 F (37.1 C)] 98.7 F (37.1 C) (04/04 0800) Pulse Rate:  [47-75] 67 (04/04 0800) Resp:  [10-31] 21 (04/04 0800) BP: (86-148)/(55-98) 103/65 (04/04 0800) SpO2:  [88 %-100 %] 94 % (04/04 0800)  General: lying in bed, NAD Neuro: lethargic, not following commands, left hemiparesis.didn't do rest of the exam due to transition to hospice  Basic Metabolic Panel: Recent Labs  Lab 03/26/23 1016 03/27/23 0824 03/29/23 0923 03/30/23 1605 03/31/23 0614  NA 140 136 140  --   --   K 4.1 4.2 3.8  --   --   CL 107 103 105  --   --   CO2 24 22 23   --   --   GLUCOSE 104* 123* 102*  --   --   BUN 14 11 15   --   --   CREATININE 1.00 0.85 0.85  --   --   CALCIUM 9.6 9.7 9.5  --   --   MG  --   --   --  2.2 2.2  PHOS  --   --   --  4.2 4.7*    CBC: Recent Labs  Lab 03/26/23 1016 03/27/23 0824 03/29/23 0923  WBC 5.9 3.5* 3.1*  NEUTROABS 4.0  --   --   HGB 12.0* 12.4* 12.2*  HCT 37.6* 37.5* 35.9*  MCV 87.9 84.8 83.9  PLT 303 310 305   Coagulation Studies: No results for input(s): "LABPROT", "INR" in the last 72 hours.  Imaging No new brain imaging   ASSESSMENT AND PLAN: 67 year old male presented with focal convulsive status epilepticus (left upper extremity jerking) which has since resolved.   Focal convulsive status epilepticus, resolved Bronchogenic lung carcinoma with metastatic disease to brain and left scapula Acute encephalopathy Cerebral edema Hyperammonemia -Seizures in the setting of intracranial mets -Acute encephalopathy likely due to medication, postictal state -Hyperammonemia likely due to Depakote use   Recommendations -Continue Keppra 1500 mg twice daily, Vimpat 200 mg twice daily, Phenobarbital 65 mg daily   - DC Depakote due to hyperammonemia  - continue anti-seizure meds ( ASMs)  if tolerated. However, if side effects are not tolerable, can discontinue and use prn Ativan for clinical seizures - Update family at bedside - Discussed plan with Dr Tacy Learn via secure chat  I have spent a total of  36 minutes with the patient reviewing hospital notes,  test results, labs and examining the patient.  > 50% of time was spent in direct patient care.    Zeb Comfort Epilepsy [facility] For questions after 5pm please refer to AMION to reach the Neurologist on call
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
## 2023-04-01 ENCOUNTER — Inpatient Hospital Stay (HOSPITAL_COMMUNITY): Payer: Medicare Other

## 2023-04-01 DIAGNOSIS — G40101 Localization-related (focal) (partial) symptomatic epilepsy and epileptic syndromes with simple partial seizures, not intractable, with status epilepticus: Secondary | ICD-10-CM | POA: Diagnosis not present

## 2023-04-01 DIAGNOSIS — C3490 Malignant neoplasm of unspecified part of unspecified bronchus or lung: Secondary | ICD-10-CM | POA: Diagnosis not present

## 2023-04-01 DIAGNOSIS — Z515 Encounter for palliative care: Secondary | ICD-10-CM | POA: Diagnosis not present

## 2023-04-01 DIAGNOSIS — G9341 Metabolic encephalopathy: Secondary | ICD-10-CM | POA: Diagnosis not present

## 2023-04-01 LAB — GLUCOSE, CAPILLARY
Glucose-Capillary: 113 mg/dL — ABNORMAL HIGH (ref 70–99)
Glucose-Capillary: 118 mg/dL — ABNORMAL HIGH (ref 70–99)
Glucose-Capillary: 120 mg/dL — ABNORMAL HIGH (ref 70–99)

## 2023-04-01 MED ORDER — DEXTROSE 10 % IV SOLN: INTRAVENOUS | Status: DC

## 2023-04-01 MED ORDER — GLYCOPYRROLATE 1 MG PO TABS: 1.0000 mg | ORAL_TABLET | ORAL | Status: DC | PRN

## 2023-04-01 MED ORDER — GLYCOPYRROLATE 0.2 MG/ML IJ SOLN: 0.4000 mg | INTRAMUSCULAR | Status: DC | PRN

## 2023-04-01 MED ORDER — GLYCOPYRROLATE 0.2 MG/ML IJ SOLN
0.2000 mg | INTRAMUSCULAR | Status: DC | PRN
  Administered 2023-04-01 (×2): 0.2 mg via INTRAVENOUS
  Filled 2023-04-01 (×2): qty 1

## 2023-04-01 NOTE — Progress Notes (Signed)
PROGRESS NOTE    Randy Ray  SWF:[id number] DOB: [birth year] DOA: 03/05/2023 PCP: Pcp, No   Brief Narrative:  67 year old with history of COPD admitted for left upper and lower extremity weakness and confusion.  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  He was also seen diagnosed with urinary tract infection on ceftriaxone.  Patient underwent IR guided left scapular lesion biopsy.  MRI brain and CTA chest abdomen pelvis showed severely metastatic disease, likely from primary bronchogenic carcinoma.  Eventually patient was transitioned to comfort care.   Assessment & Plan:  Principal Problem:   Simple partial status epilepticus Active Problems:   Lung cancer metastatic to brain   Acute metabolic encephalopathy   Chronic obstructive pulmonary disease   UTI (urinary tract infection)   Cocaine abuse   Overweight (BMI 25.0-29.9)   Acute hyperactive delirium with agitation likely in the setting of hemorrhagic brain mets  Alcohol dependence, now with alcohol withdrawal Polysubstance abuse Patient has now been transitioned to comfort care.   Complex partial status epilepticus EEG showed status epilepticus.  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  Seen by palliative.   Acute urinary tract infection with E. coli Completed 5-day treatment with Rocephin   COPD, not in exacerbation Continue nebs   Tobacco dependence Continue nicotine patch to prevent continued salt  Currently patient is hospice care    DC Anticipate Cortrak Anticipate inhospital death  Subjective: Still on NRB in place. Cortrak in place.  Will remove it Patient is unresponsive.    Examination:  Unresponsive. NAD. B/l significant coarse BS.   Objective: Vitals:   03/31/23 1200 03/31/23  1509 04/01/23 0344 04/01/23 0500  BP: (!) 87/68 116/75 (!) 100/58   Pulse: (!) 103 (!) 119 (!) 113   Resp: 17  20   Temp:   99.3 F (37.4 C)   TempSrc:   Oral   SpO2: (!) 89% (!) 76% 91%   Weight:    90 kg  Height:        Intake/Output Summary (Last 24 hours) at 04/01/2023 0824 Last data filed at 03/31/2023 1200 Gross per 24 hour  Intake 240.71 ml  Output --  Net 240.71 ml   Filed Weights   03/27/23 0055 04/01/23 0500  Weight: 90.1 kg 90 kg    Scheduled Meds:  Chlorhexidine Gluconate Cloth  6 each Topical Daily   feeding supplement (PROSource TF20)  60 mL Per Tube Daily   LORazepam  0-4 mg Intravenous Q8H   nicotine  21 mg Transdermal Daily   mouth rinse  15 mL Mouth Rinse 4 times per day   PHENObarbital  65 mg Intravenous Daily   Continuous Infusions:  dextrose 40 mL/hr at 04/01/23 0544   feeding supplement (OSMOLITE 1.5 CAL) 15 mL/hr at 03/31/23 1200   HYDROmorphone 0.5 mg/hr (03/31/23 1318)   lacosamide (VIMPAT) IV 200 mg (03/31/23 2308)   levETIRAcetam 1,500 mg (03/31/23 2220)    Nutritional status Signs/Symptoms: meal completion < 25% Interventions: Tube feeding Body mass index is 28.8 kg/m.  Data Reviewed:   CBC: Recent Labs  Lab 03/16/2023 1016 03/27/23 0824 03/29/23 0923  WBC 5.9 3.5* 3.1*  NEUTROABS 4.0  --   --   HGB 12.0* 12.4* 12.2*  HCT 37.6* 37.5* 35.9*  MCV 87.9 84.8 83.9  PLT 303 310 305  Basic Metabolic Panel: Recent Labs  Lab 01/04/23 1016 03/27/23 0824 03/29/23 0923 03/30/23 1605 03/31/23 0614  NA 140 136 140  --   --   K 4.1 4.2 3.8  --   --   CL 107 103 105  --   --   CO2 24 22 23   --   --   GLUCOSE 104* 123* 102*  --   --   BUN 14 11 15   --   --   CREATININE 1.00 0.85 0.85  --   --   CALCIUM 9.6 9.7 9.5  --   --   MG  --   --   --  2.2 2.2  PHOS  --   --   --  4.2 4.7*   GFR: Estimated Creatinine Clearance: 97.2 mL/min (by C-G formula based on SCr of 0.85 mg/dL). Liver Function Tests: Recent Labs  Lab 01/04/23 1016   AST 17  ALT 10  ALKPHOS 57  BILITOT 0.6  PROT 7.9  ALBUMIN 3.8   No results for input(s): "LIPASE", "AMYLASE" in the last 168 hours. Recent Labs  Lab 03/28/23 1459 03/31/23 0614  AMMONIA 39* 43*   Coagulation Profile: Recent Labs  Lab 03/28/23 0540  INR 1.1   Cardiac Enzymes: No results for input(s): "CKTOTAL", "CKMB", "CKMBINDEX", "TROPONINI" in the last 168 hours. BNP (last 3 results) No results for input(s): "PROBNP" in the last 8760 hours. HbA1C: No results for input(s): "HGBA1C" in the last 72 hours. CBG: Recent Labs  Lab 03/31/23 0317 03/31/23 0818 03/31/23 2028 04/01/23 0034 04/01/23 0335  GLUCAP 118* 119* 116* 118* 113*   Lipid Profile: No results for input(s): "CHOL", "HDL", "LDLCALC", "TRIG", "CHOLHDL", "LDLDIRECT" in the last 72 hours. Thyroid Function Tests: No results for input(s): "TSH", "T4TOTAL", "FREET4", "T3FREE", "THYROIDAB" in the last 72 hours. Anemia Panel: No results for input(s): "VITAMINB12", "FOLATE", "FERRITIN", "TIBC", "IRON", "RETICCTPCT" in the last 72 hours. Sepsis Labs: Recent Labs  Lab 01/04/23 1016  LATICACIDVEN 1.5    Recent Results (from the past 240 hour(s))  Urine Culture     Status: Abnormal   Collection Time: 01/04/23  5:03 PM   Specimen: Urine, Random  Result Value Ref Range Status   Specimen Description   Final    URINE, RANDOM Performed at [facility], [address] [place], [place] [address]    Special Requests   Final    NONE Reflexed from [id number] Performed at [facility], [address] [place], [place] [address]    Culture >=100,000 COLONIES/mL ESCHERICHIA COLI (A)  Final   Report Status 03/29/2023 FINAL  Final   Organism ID, Bacteria ESCHERICHIA COLI (A)  Final      Susceptibility   Escherichia coli - MIC*    AMPICILLIN 4 SENSITIVE Sensitive     CEFAZOLIN <=4 SENSITIVE Sensitive     CEFEPIME <=0.12 SENSITIVE Sensitive     CEFTRIAXONE <=0.25 SENSITIVE Sensitive      CIPROFLOXACIN <=0.25 SENSITIVE Sensitive     GENTAMICIN <=1 SENSITIVE Sensitive     IMIPENEM <=0.25 SENSITIVE Sensitive     NITROFURANTOIN <=16 SENSITIVE Sensitive     TRIMETH/SULFA <=20 SENSITIVE Sensitive     AMPICILLIN/SULBACTAM <=2 SENSITIVE Sensitive     PIP/TAZO <=4 SENSITIVE Sensitive     * >=100,000 COLONIES/mL ESCHERICHIA COLI  MRSA Next Gen by PCR, Nasal     Status: None   Collection Time: 03/28/23  2:51 AM   Specimen: Nasal Mucosa; Nasal Swab  Result Value  Ref Range Status   MRSA by PCR Next Gen NOT DETECTED NOT DETECTED Final    Comment: (NOTE) The GeneXpert MRSA Assay (FDA approved for NASAL specimens only), is one component of a comprehensive MRSA colonization surveillance program. It is not intended to diagnose MRSA infection nor to guide or monitor treatment for MRSA infections. Test performance is not FDA approved in patients less than 88 years old. Performed at [facility], [address], [place], [place] [address]       Radiology Studies: DG CHEST PORT 1 VIEW  Result Date: 04/01/2023 CLINICAL DATA:  67 year old male with metastatic disease.  Hypoxia. EXAM: PORTABLE CHEST 1 VIEW COMPARISON:  CT Chest, Abdomen, and Pelvis 04-25-23. FINDINGS: Portable AP semi upright view at 0519 hours. Upper lobe predominant emphysema demonstrated on recent CT. Left lingula mass demonstrated at that time, surrounding left lower lung airspace disease appears increased since the CT with combined air bronchograms and reticulonodular opacity. Mediastinal contours are stable. Right lung appears stable, negative. No pneumothorax. Enteric feeding tube in place, terminates in the stomach. Negative visible bowel gas. No acute osseous abnormality identified. IMPRESSION: 1. Increasing left lower lung airspace disease superimposed on lingula lung mass recently diagnosed on CT. This may be superimposed pneumonia. Aspiration felt less likely. 2. Enteric feeding tube terminates in the stomach.  Electronically Signed   By: Odessa Fleming M.D.   On: 04/01/2023 05:48   DG Abd Portable 1V  Result Date: 03/30/2023 CLINICAL DATA:  Feeding tube placement EXAM: PORTABLE ABDOMEN - 1 VIEW COMPARISON:  Portable exam 1158 hours without priors for comparison FINDINGS: Feeding tube extends into stomach with tip at fundus. Nonobstructive bowel gas pattern. LEFT lung base clear. IMPRESSION: Tip of feeding tube at gastric fundus. Electronically Signed   By: Ulyses Southward M.D.   On: 03/30/2023 12:19      LOS: 6 days   Time spent= 35 mins  Elexa Kivi Joline Maxcy, MD [facility]  If 7PM-7AM, please contact night-coverage  04/01/2023, 8:24 AM

## 2023-04-01 NOTE — Progress Notes (Signed)
[facility] 8V29 [facility]) Hospice hospital liaison note  Mr. Write is referred to hospice home in [place] for end of life care. He however is determined to be unstable for transport per medical team.   Liaison continues to follow peripherally and will remain available for any needs.   Thank you,  Thea Gist, BSN, RN Hospice hospital liaison [phone]

## 2023-04-01 NOTE — Care Management Important Message (Signed)
Important Message  Patient Details  Name: JAKI DUDAK MRN: [id number] Date of Birth: [birth year]   Medicare Important Message Given:  No     Sherilyn Banker 04/01/2023, 12:12 PM

## 2023-04-01 NOTE — Progress Notes (Signed)
                                                     Palliative Care Progress Note, Assessment & Plan   Patient Name: Randy Ray       Date: 04/01/2023 DOB: [birth year]  Age: 67 y.o. MRN#: [id number] Attending Physician: Dimple Nanas, MD Primary Care Physician: Pcp, No Admit Date: 03/18/2023  Subjective: Patient is actively dying. His respirations are shallow.  Terminal secretions are auditory without use of stethoscope.  He does not respond to verbal stimuli or light touch.  No family or friends are present at bedside.  HPI: 67 y.o. male  with past medical history of COPD and tobacco abuse who presented to the emergency room on 3/30 complaining of left upper extremity tremors and left-sided weakness as well as confusion. He was admitted on 03/25/2023 with simple partial status epilepticus, lung cancer with mets to brain (newly diagnosed), and acute metabolic encephalopathy.   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  Decision was made to shift to full comfort measures on 4/4.  Summary of counseling/coordination of care: After reviewing the patient's chart and assessing the patient at bedside, I spoke with nurse in regards to plan and goals of care.  Core track is still in place.  Patient had episodes with clearing secretions overnight.  I spoke with patient's sister over the phone this morning in regards to removing cortrak. She shares she and family are in agreement to remove cortrak in order to ensure maximizing patient's comfort.  I personally removed cortrak  Patient's breathing is mildly labored.  I personally administered 1 bolus of 0.5 mg of Dilaudid.  I encouraged nursing to give boluses as needed and to increase basal rate by 0.5 mg/h if 2 or more boluses are given within the hour.  Robinul dosing  increased to 0.4 mg in order to more appropriately managed terminal secretions.  No further adjustments to medications needed at this time.  I again spoke with patient's sister and shared that patient is not stable for transport.  She was understanding and appreciative.  Full comfort measures remain.  I counseled with attending Dr. Nelson Chimes, hospice liaison, dayshift RN, and TOC in regards to patient remaining in hospital for anticipated in-hospital death.  Physical Exam Vitals reviewed.  Constitutional:      General: He is in acute distress.     Appearance: He is ill-appearing.  HENT:     Head: Normocephalic.     Mouth/Throat:     Mouth: Mucous membranes are moist.  Cardiovascular:     Pulses: Normal pulses.  Pulmonary:     Comments: Shallow respirations, auditory terminal secretions Skin:    General: Skin is warm.     Coloration: Skin is pale.             Total Time 50 minutes   Jerrica Thorman L. Manon Hilding, FNP-BC Palliative Medicine Team Team Phone # [phone]

## 2023-04-01 NOTE — Progress Notes (Signed)
eLink Physician-Brief Progress Note Patient Name: TIMON FOGLIA DOB: October 09, 1956 MRN: [id number]   Date of Service  04/01/2023  HPI/Events of Note  Hypoxia - Nursing concerned about aspiration d/t patient dropping sat into 70's acutely and now requiring 100% NRB to maintain sat = 95%. Patient is a DNR/DNI; however, has tube feeds running at 65 mL/hour. Last CBG = 113.  eICU Interventions  Plan: Hold tube feeding until the patient is re-evaluated by the rounding team in AM. D10W IV infusion to run at 40 mL/hour. Portable CXR STAT.     Intervention Category Major Interventions: Hypoxemia - evaluation and management  Kaeden Mester Eugene 04/01/2023, 4:11 AM

## 2023-04-01 NOTE — Progress Notes (Signed)
   04/01/23 0344  Vitals  Temp 99.3 F (37.4 C)  Temp Source Oral  BP (!) 100/58  MAP (mmHg) 69  BP Location Right Arm  BP Method Automatic  Patient Position (if appropriate) Sitting  Pulse Rate (!) 113  Pulse Rate Source Monitor  Resp 20  MEWS COLOR  MEWS Score Color Comfort Care Only  Oxygen Therapy  SpO2 91 %  O2 Device Non-rebreather Mask  O2 Flow Rate (L/min) 15 L/min  MEWS Score  MEWS Temp 0  MEWS Systolic 1  MEWS Pulse 2  MEWS RR 0  MEWS LOC 2  MEWS Score 5  Provider Notification  Provider Name/Title Elink Physician  Date Provider Notified 04/01/23  Time Provider Notified [phone]  Method of Notification Page;Call  Notification Reason Other (Comment) (patient has Cortrak,patient has thick mucus from the nose and in the mouth,suction done , charge nurse informed,respiratory informed to assist  with suctioning,Elink provider notified.)  Provider response See new orders (order to hold feeding)  Date of Provider Response 04/01/23  Time of Provider Response 0403

## 2023-04-01 NOTE — Progress Notes (Signed)
4/5 Patient under Comfort Care, IMM Letter respectfully not given.

## 2023-04-01 NOTE — Progress Notes (Signed)
PT Cancellation Note  Patient Details Name: Randy Ray MRN: [id number] DOB: 12-17-1956   Cancelled Treatment:    Reason Eval/Treat Not Completed: (P) Other (comment) (Pt transitioning to comfort care per chart review. Will sign off for now.)   [name] 04/01/2023, 10:27 AM

## 2023-04-01 NOTE — Progress Notes (Signed)
OT Cancellation Note  Patient Details Name: MASONJAMES SCHIEFFER MRN: [id number] DOB: March 06, 1956   Cancelled Treatment:    Reason Eval/Treat Not Completed: Other (comment)  Per chart review, family meeting was held yesterday and decision was made for comfort care with pt transitioning to Hospice care. Will sign off.   Limmie Patricia, OTR/L,CBIS  Supplemental OT - [facility] Secure Chat Preferred   04/01/2023, 8:36 AM

## 2023-04-01 NOTE — Progress Notes (Addendum)
RN received report and assumed care of PT. RN at bedside to assess. Pt does not respond to voice, he does not follow commands, and does not respond to light touch. Breathing is labored, shallow, with several seconds of breathing pause. Mouth care completed. Pt repositioned for comfort. RN will continue to assess for discomfort or change in condition.   2227 Rounding completed. Pt appears comfortable. Cheyne Stokes and apneas breathing noted. RN will continue to round.  Hourly rounding completed. Pt breathing is progressively getting worse with more than 6-7 sec pause in between shallow breaths and gasps. Pt is not alert, Pt does not respond to voice, painful stimulation, and skin is currently clammy, warm, and skin is pale.   HR 99-100 bpm and BP is  92/56. Pt appears comfortable. Pt has not had any output tonight and nothing by mouth. Pt suctioned and mouth care performed.

## 2023-04-02 DIAGNOSIS — Z515 Encounter for palliative care: Secondary | ICD-10-CM | POA: Diagnosis not present

## 2023-04-02 DIAGNOSIS — Z7189 Other specified counseling: Secondary | ICD-10-CM | POA: Diagnosis not present

## 2023-04-02 DIAGNOSIS — G40101 Localization-related (focal) (partial) symptomatic epilepsy and epileptic syndromes with simple partial seizures, not intractable, with status epilepticus: Secondary | ICD-10-CM | POA: Diagnosis not present

## 2023-04-02 MED ORDER — GLYCOPYRROLATE 0.2 MG/ML IJ SOLN: 0.4000 mg | INTRAMUSCULAR | Status: DC

## 2023-04-02 MED ORDER — GLYCOPYRROLATE 0.2 MG/ML IJ SOLN
0.6000 mg | INTRAMUSCULAR | Status: DC
  Administered 2023-04-02 – 2023-04-05 (×17): 0.6 mg via INTRAVENOUS
  Filled 2023-04-02 (×17): qty 3

## 2023-04-02 NOTE — Progress Notes (Signed)
PROGRESS NOTE    Randy Ray  SFS:[id number] DOB: April 06, 1956 DOA: 03/27/2023 PCP: Pcp, No   Brief Narrative:  67 year old with history of COPD admitted for left upper and lower extremity weakness and confusion.  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  He was also seen diagnosed with urinary tract infection on ceftriaxone.  Patient underwent IR guided left scapular lesion biopsy.  MRI brain and CTA chest abdomen pelvis showed severely metastatic disease, likely from primary bronchogenic carcinoma.  Eventually patient was transitioned to comfort care.   Assessment & Plan:    Acute hyperactive delirium with agitation likely in the setting of hemorrhagic brain mets  Alcohol dependence, now with alcohol withdrawal Polysubstance abuse Patient has now been transitioned to comfort care.   Complex partial status epilepticus EEG showed status epilepticus.  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  Seen by palliative.   Acute urinary tract infection with E. coli Completed 5-day treatment with Rocephin   COPD, not in exacerbation Continue nebs   Tobacco dependence Continue nicotine patch to prevent continued salt  Currently patient is hospice care    DC Anticipate Cortrak Anticipate inhospital death Increase robinul  Subjective: Increased secretion, decrease breathing. No distress.    Examination:  Bilateral crackles.  No edema  Objective: Vitals:   04/01/23 0344 04/01/23 0500 04/02/23 0527 04/02/23 0747  BP: (!) 100/58  (!) 92/56 (!) 92/54  Pulse: (!) 113  (!) 102 (!) 108  Resp: 20  (!) 6 (!) 9  Temp: 99.3 F (37.4 C)  98.3 F (36.8 C) 98 F (36.7 C)  TempSrc: Oral  Axillary Oral  SpO2: 91%  90% (!) 86%  Weight:  90 kg    Height:         Intake/Output Summary (Last 24 hours) at 04/02/2023 1514 Last data filed at 04/02/2023 0534 Gross per 24 hour  Intake 775.53 ml  Output 0 ml  Net 775.53 ml    Filed Weights   03/27/23 0055 04/01/23 0500  Weight: 90.1 kg 90 kg    Scheduled Meds:  glycopyrrolate  0.6 mg Intravenous Q4H   nicotine  21 mg Transdermal Daily   mouth rinse  15 mL Mouth Rinse 4 times per day   PHENObarbital  65 mg Intravenous Daily   Continuous Infusions:  HYDROmorphone 0.5 mg/hr (04/02/23 0534)   lacosamide (VIMPAT) IV 200 mg (04/02/23 1059)   levETIRAcetam 1,500 mg (04/02/23 1056)    Nutritional status Signs/Symptoms: meal completion < 25% Interventions: Tube feeding Body mass index is 28.8 kg/m.  Data Reviewed:   CBC: Recent Labs  Lab 03/27/23 0824 03/29/23 0923  WBC 3.5* 3.1*  HGB 12.4* 12.2*  HCT 37.5* 35.9*  MCV 84.8 83.9  PLT 310 305    Basic Metabolic Panel: Recent Labs  Lab 03/27/23 0824 03/29/23 0923 03/30/23 1605 03/31/23 0614  NA 136 140  --   --   K 4.2 3.8  --   --   CL 103 105  --   --   CO2 22 23  --   --   GLUCOSE 123* 102*  --   --   BUN 11 15  --   --   CREATININE 0.85 0.85  --   --   CALCIUM 9.7 9.5  --   --   MG  --   --  2.2 2.2  PHOS  --   --  4.2 4.7*    GFR: Estimated Creatinine Clearance: 97.2 mL/min (by C-G formula based on SCr of 0.85 mg/dL). Liver Function Tests: No results for input(s): "AST", "ALT", "ALKPHOS", "BILITOT", "PROT", "ALBUMIN" in the last 168 hours.  No results for input(s): "LIPASE", "AMYLASE" in the last 168 hours. Recent Labs  Lab 03/28/23 1459 03/31/23 0614  AMMONIA 39* 43*    Coagulation Profile: Recent Labs  Lab 03/28/23 0540  INR 1.1    Cardiac Enzymes: No results for input(s): "CKTOTAL", "CKMB", "CKMBINDEX", "TROPONINI" in the last 168 hours. BNP (last 3 results) No results for input(s): "PROBNP" in the last 8760 hours. HbA1C: No results for input(s): "HGBA1C" in the last 72 hours. CBG: Recent Labs   Lab 03/31/23 0818 03/31/23 2028 04/01/23 0034 04/01/23 0335 04/01/23 0838  GLUCAP 119* 116* 118* 113* 120*    Lipid Profile: No results for input(s): "CHOL", "HDL", "LDLCALC", "TRIG", "CHOLHDL", "LDLDIRECT" in the last 72 hours. Thyroid Function Tests: No results for input(s): "TSH", "T4TOTAL", "FREET4", "T3FREE", "THYROIDAB" in the last 72 hours. Anemia Panel: No results for input(s): "VITAMINB12", "FOLATE", "FERRITIN", "TIBC", "IRON", "RETICCTPCT" in the last 72 hours. Sepsis Labs: No results for input(s): "PROCALCITON", "LATICACIDVEN" in the last 168 hours.   Recent Results (from the past 240 hour(s))  Urine Culture     Status: Abnormal   Collection Time: 03/27/2023  5:03 PM   Specimen: Urine, Random  Result Value Ref Range Status   Specimen Description   Final    URINE, RANDOM Performed at [facility], [address] [place], [place] [address]    Special Requests   Final    NONE Reflexed from [id number] Performed at [facility], [address] [place], [place] [address]    Culture >=100,000 COLONIES/mL ESCHERICHIA COLI (A)  Final   Report Status 03/29/2023 FINAL  Final   Organism ID, Bacteria ESCHERICHIA COLI (A)  Final      Susceptibility   Escherichia coli - MIC*    AMPICILLIN 4 SENSITIVE Sensitive     CEFAZOLIN <=4 SENSITIVE Sensitive     CEFEPIME <=0.12 SENSITIVE Sensitive     CEFTRIAXONE <=0.25 SENSITIVE Sensitive     CIPROFLOXACIN <=0.25 SENSITIVE Sensitive     GENTAMICIN <=1 SENSITIVE Sensitive     IMIPENEM <=0.25 SENSITIVE Sensitive     NITROFURANTOIN <=16 SENSITIVE Sensitive     TRIMETH/SULFA <=20 SENSITIVE Sensitive     AMPICILLIN/SULBACTAM <=2 SENSITIVE Sensitive     PIP/TAZO <=4 SENSITIVE Sensitive     * >=100,000 COLONIES/mL ESCHERICHIA COLI  MRSA Next Gen by PCR, Nasal     Status: None   Collection Time: 03/28/23  2:51 AM   Specimen: Nasal Mucosa; Nasal Swab  Result Value Ref Range Status   MRSA by PCR Next Gen NOT DETECTED  NOT DETECTED Final    Comment: (NOTE) The GeneXpert MRSA Assay (FDA approved for NASAL specimens only), is one component of a comprehensive MRSA colonization surveillance program. It is not intended to diagnose MRSA infection nor to guide or monitor treatment for MRSA infections. Test performance is not FDA approved in patients less than 72 years old. Performed at [facility], [address] [place], [place] [address]       Radiology Studies: DG CHEST PORT 1 VIEW  Result Date: 04/01/2023 CLINICAL DATA:  67 year old male with metastatic disease.  Hypoxia. EXAM: PORTABLE CHEST 1 VIEW COMPARISON:  CT Chest, Abdomen, and Pelvis 03/24/2023. FINDINGS:  Portable AP semi upright view at 0519 hours. Upper lobe predominant emphysema demonstrated on recent CT. Left lingula mass demonstrated at that time, surrounding left lower lung airspace disease appears increased since the CT with combined air bronchograms and reticulonodular opacity. Mediastinal contours are stable. Right lung appears stable, negative. No pneumothorax. Enteric feeding tube in place, terminates in the stomach. Negative visible bowel gas. No acute osseous abnormality identified. IMPRESSION: 1. Increasing left lower lung airspace disease superimposed on lingula lung mass recently diagnosed on CT. This may be superimposed pneumonia. Aspiration felt less likely. 2. Enteric feeding tube terminates in the stomach. Electronically Signed   By: Odessa FlemingH  Hall M.D.   On: 04/01/2023 05:48      LOS: 7 days   Time spent= 35 mins  Lynden OxfordPranav Delson Dulworth, MD [facility]  If 7PM-7AM, please contact night-coverage  04/02/2023, 3:14 PM

## 2023-04-02 NOTE — Progress Notes (Addendum)
   Palliative Medicine Inpatient Follow Up Note HPI: 66 y.o. male  with past medical history of COPD and tobacco abuse who presented to the emergency room on 3/30 complaining of left upper extremity tremors and left-sided weakness as well as confusion. 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   Decision was made to shift to full comfort measures on 4/4.  Today's Discussion 04/02/2023  *Please note that this is a verbal dictation therefore any spelling or grammatical errors are due to the "Dragon [employer] One" system interpretation.  Chart reviewed inclusive of vital signs, progress notes, laboratory results, and diagnostic images.   I met at bedside with Randy Ray this morning. He was somnolent and had audible rattling of secretions. Noted to be on 4LPM [place], respirations cheyne-stokes.   I called patients sister, Randy Ray this morning creating space and opportunity for patients sister, Randy Ray to explore thoughts feelings and fears regarding current medical situation.She shares that she plans to be here later in the morning. She has a clear idea of what is happening.   Questions and concerns addressed/Palliative Support Provided.  _______________________________ Addendum:  I stopped by Randy Ray room this afternoon and met with his niece and nephew. Discussed patients present health state and prognosis.   I shared that I placed the "Gone From My Site" booklet with them.   Support provided.  Objective Assessment: Vital Signs Vitals:   04/02/23 0527 04/02/23 0747  BP: (!) 92/56 (!) 92/54  Pulse: (!) 102 (!) 108  Resp: (!) 6 (!) 9  Temp: 98.3 F (36.8 C) 98 F (36.7 C)  SpO2: 90% (!) 86%    Intake/Output Summary (Last 24 hours) at 04/02/2023 8588 Last data filed at 04/02/2023 0534 Gross per 24 hour   Intake 775.53 ml  Output 0 ml  Net 775.53 ml   Last Weight  Most recent update: 04/01/2023  7:00 AM    Weight  90 kg (198 lb 6.6 oz)            Gen:  Elderly AA M in NAD HEENT: Dry mucous membranes CV: Regular rate and rhythm, no murmurs rubs or gallops PULM:  Notable rattle, on 4LPM [place], cheyne-stokes respirations ABD: soft/nontender  EXT: No edema  Neuro: Somnolent  SUMMARY OF RECOMMENDATIONS   DNAR/DNI  Comfort Measures  On dilaudid gtt at this time  Increased glycopyrrolate to 0.4mg  Q4H ATC  Additional comfort medications per [facility]  Unrestricted visitation  Wean O2 to RA  Ongoing PMT support  Billing based on MDM: High  Problems Addressed: One acute or chronic illness or injury that poses a threat to life or bodily function  Amount and/or Complexity of Data: Category 3:Discussion of management or test interpretation with external physician/other qualified health care professional/appropriate source (not separately reported)  Risks: Parenteral controlled substances and Decision not to resuscitate or to de-escalate care because of poor prognosis ______________________________________________________________________________________ Randy Ray [place] Palliative Medicine Team Team Cell Phone: [phone] Please utilize secure chat with additional questions, if there is no response within 30 minutes please call the above phone number  Palliative Medicine Team providers are available by phone from 7am to 7pm daily and can be reached through the team cell phone.  Should this patient require assistance outside of these hours, please call the patient's attending physician.

## 2023-04-03 DIAGNOSIS — Z7189 Other specified counseling: Secondary | ICD-10-CM | POA: Diagnosis not present

## 2023-04-03 DIAGNOSIS — G40101 Localization-related (focal) (partial) symptomatic epilepsy and epileptic syndromes with simple partial seizures, not intractable, with status epilepticus: Secondary | ICD-10-CM | POA: Diagnosis not present

## 2023-04-03 DIAGNOSIS — Z515 Encounter for palliative care: Secondary | ICD-10-CM | POA: Diagnosis not present

## 2023-04-03 MED ORDER — KETOROLAC TROMETHAMINE 15 MG/ML IJ SOLN
15.0000 mg | Freq: Three times a day (TID) | INTRAMUSCULAR | Status: DC
  Administered 2023-04-03 – 2023-04-05 (×5): 15 mg via INTRAVENOUS
  Filled 2023-04-03 (×5): qty 1

## 2023-04-03 MED ORDER — SCOPOLAMINE 1 MG/3DAYS TD PT72
1.0000 | MEDICATED_PATCH | TRANSDERMAL | Status: DC
  Administered 2023-04-03: 1.5 mg via TRANSDERMAL
  Filled 2023-04-03: qty 1

## 2023-04-03 MED ORDER — KETOROLAC TROMETHAMINE 15 MG/ML IJ SOLN
15.0000 mg | Freq: Three times a day (TID) | INTRAMUSCULAR | Status: DC
  Filled 2023-04-03 (×2): qty 1

## 2023-04-03 NOTE — Progress Notes (Addendum)
   Palliative Medicine Inpatient Follow Up Note HPI: 67 y.o. male  with past medical history of COPD and tobacco abuse who presented to the emergency room on 3/30 complaining of left upper extremity tremors and left-sided weakness as well as confusion. 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   Decision was made to shift to full comfort measures on 4/4.  Today's Discussion 04/03/2023  *Please note that this is a verbal dictation therefore any spelling or grammatical errors are due to the "Dragon Medical One" system interpretation.  Chart reviewed inclusive of vital signs, progress notes, laboratory results, and diagnostic images.   I met at bedside with Randy Ray this morning. He was noted to be quite diaphoretic and continues to have notable rattling. I added Tordol Q8H x3 doses as well as a scopolamine patch. He did appear more labored therefore was given a bolus of dilaudid, upon my time at bedside this did settle his breathing.   No family was present during my time at bedside though I will call this morning to provide a daily update.  ______________________ Addendum:  I was able to call patients sister, Randy Ray this evening and provide an update. She endorsed no new questions or concerns.  Support was provided.  Objective Assessment: Vital Signs Vitals:   04/03/23 0338 04/03/23 0754  BP: 104/66 (!) 97/53  Pulse: (!) 112 (!) 105  Resp: (!) 9 13  Temp: 100.3 F (37.9 C) (!) 101 F (38.3 C)  SpO2: (!) 80% 97%    Intake/Output Summary (Last 24 hours) at 04/03/2023 0174 Last data filed at 04/03/2023 0700 Gross per 24 hour  Intake 302.19 ml  Output 100 ml  Net 202.19 ml    Last Weight  Most recent update: 04/01/2023  7:00 AM    Weight  90 kg (198 lb 6.6 oz)            Gen:  Elderly  AA M in NAD HEENT: Dry mucous membranes CV: Irregular rate and rhythm  PULM:  Notable rattle, on RA, cheyne-stokes respirations ABD: soft/nontender  EXT: No edema  Neuro: Somnolent  SUMMARY OF RECOMMENDATIONS   DNAR/DNI  Comfort Measures  On dilaudid gtt at this time  Secretions: Glycopyrrolate to 0.4mg  Q4H ATC, Scopolamine patch  Fevers: Tordol 15mg  IV Q8H x3 doses  Additional comfort medications per [facility]  Unrestricted visitation  Weaned O2 to RA  Ongoing PMT support  Billing based on MDM: High  ______________________________________________________________________________________ Randy Ray Palliative Medicine Team Team Cell Phone: [phone] Please utilize secure chat with additional questions, if there is no response within 30 minutes please call the above phone number  Palliative Medicine Team providers are available by phone from 7am to 7pm daily and can be reached through the team cell phone.  Should this patient require assistance outside of these hours, please call the patient's attending physician.

## 2023-04-03 NOTE — Progress Notes (Signed)
Pt maintained throughout the night. Breathing shallow with 6-8 sec pauses. Secretions are audile without stethoscope. Mouth care performed q4 hours with mouth care suction toothbrush. Secretions suctioned. Pt repositioned for comfort. Skin is clammy. Pt is mute and does not respond to pain or voice.

## 2023-04-03 NOTE — Progress Notes (Signed)
[facility] Progress Note Patient: Randy Ray ZDG:[id number] DOB: Feb 02, 1956 DOA: 03/25/2023  DOS: the patient was seen and examined on 04/03/2023  Brief hospital course: Patient is a 67 year old male with past medical history of COPD and tobacco abuse who presented to the ER on 3/30 complaining of left upper extremity tremors and left-sided weakness as well as confusion.  He had been seen 2 days prior to emergency room for numbness of left upper extremity and was discharged home.  Since then, he started having jerking of his left upper extremity and twitching of his left side of his face.  In ER CT scan of head revealed hemorrhagic brain metastases (new finding/new diagnosis of cancer) and patient was transferred to [facility] for EEG.  Further workup revealed what appeared to be primary lung cancer.  Other labs noteworthy for urinary tract infection and UDS positive for cocaine and tricyclic antidepressants.  Patient admitted to the hospitalist service with neurology consultation and started on antiepileptics. Following admission, EEG done morning of 3/31 noting electrographic status epilepticus arising from right central parietal region confirming ongoing epilepsy.  As day progressed, patient became more more agitated despite doses of Ativan.  By that night, transferred to ICU on Precedex drip with critical care following.  Patient underwent ultrasound-guided biopsy of left scapular mass by interventional radiology on 4/1. 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  At this point family decided to transition to complete comfort and consultation with palliative care. Currently anticipating hospital death. Assessment and Plan: Complex partial status epilepticus. Acute hyperactive delirium. Acute combination of toxic encephalopathy as well as postictal encephalopathy. E. coli UTI. Presented with confusion and tremors. Workup showed status  epilepticus. Admitted to the ICU. Neurology and palliative care were following. 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 Also had UTI treated with antibiotics. Currently comfort care. Continue seizure medication.  Alcohol dependence. Polysubstance abuse. UDS positive for cocaine, TCA, benzo. Likely responsible for some of the agitation seen in the hospital earlier.  Currently comfort care.  Secretion issues. Currently on scheduled Robinul as well as scopolamine patch. Will monitor.  COPD. Active smoker. On nicotine patch which was removed. Continue comfort care.  Fever. Currently on Toradol scheduled Monitor.  Continue comfort.  Goals of care conversation. Patient presented with tremors.  Found to have hemorrhagic brain metastasis.  Developed status epilepticus.  Found to have poorly differentiated metastatic lung cancer with metastasis to brain and bones.  No significant improvement in mentation despite aggressive therapy for status epilepticus. Palliative care was consulted and family has decided to transition to complete comfort. Currently anticipating hospital death.   Subjective: Unresponsive.  Nonverbal.  Shallow breaths.  Significant gurgling seen.  Physical Exam: In moderate distress. No rash. No edema. S1-S2 present. Bilateral crackling heard. No wheezing.  Data Reviewed: I have Reviewed nursing notes, Vitals, and Lab results.  Disposition: Status is: Inpatient Remains inpatient appropriate because: Anticipating hospital death  Family Communication: No one at bedside.  Family was at bedside on 4/6. Level of care: Palliative Care   Vitals:   04/02/23 0527 04/02/23 0747 04/03/23 0338 04/03/23 0754  BP: (!) 92/56 (!) 92/54 104/66 (!) 97/53  Pulse: (!) 102 (!) 108 (!) 112 (!) 105  Resp: (!) 6 (!) 9 (!) 9 13  Temp: 98.3 F (36.8 C) 98 F (36.7 C) 100.3 F (37.9 C) (!) 101 F  (38.3 C)  TempSrc: Axillary Oral Oral Oral  SpO2: 90% (!) 86% (!) 80% 97%  Weight:      Height:         Author: Lynden Oxford, MD 04/03/2023 2:53 PM  Please look on [email or web address] to find out who is on call.

## 2023-04-04 DIAGNOSIS — C3490 Malignant neoplasm of unspecified part of unspecified bronchus or lung: Secondary | ICD-10-CM | POA: Diagnosis not present

## 2023-04-04 DIAGNOSIS — C7931 Secondary malignant neoplasm of brain: Secondary | ICD-10-CM | POA: Diagnosis not present

## 2023-04-04 DIAGNOSIS — G9341 Metabolic encephalopathy: Secondary | ICD-10-CM | POA: Diagnosis not present

## 2023-04-04 DIAGNOSIS — G40101 Localization-related (focal) (partial) symptomatic epilepsy and epileptic syndromes with simple partial seizures, not intractable, with status epilepticus: Secondary | ICD-10-CM | POA: Diagnosis not present

## 2023-04-04 MED ORDER — SODIUM CHLORIDE 0.9 % IV SOLN: 12.5000 mg | Freq: Four times a day (QID) | INTRAVENOUS | Status: DC | PRN

## 2023-04-04 NOTE — Progress Notes (Signed)
[facility] PROGRESS NOTE  Patient: Randy Ray ANV:[id number]   PCP: Pcp, No DOB: 13-Jul-1956   DOA: 03/19/2023   DOS: 04/04/2023    Subjective: Continues to have shortness of breath.  Breathing rate actually increased on 4/8.  Secretion appears to have improved.  Fever appears to have resolved.  Objective:   Vitals:   04/03/23 0338 04/03/23 0754 04/04/23 0508 04/04/23 0810  BP: 104/66 (!) 97/53 117/79 103/72  Pulse: (!) 112 (!) 105 (!) 115 (!) 111  Resp: (!) 9 13 (!) 27 (!) 25  Temp: 100.3 F (37.9 C) (!) 101 F (38.3 C) 99.9 F (37.7 C) 98.2 F (36.8 C)  TempSrc: Oral Oral Oral Oral  SpO2: (!) 80% 97% (!) 80% (!) 87%  Weight:      Height:        Bilateral crackles heard but improving. Respiratory rate elevated.  Assessment and plan: Active Issues Comfort care. Agree with palliative care to increase the Dilaudid rate to 1 mg/h. Continue with current care. Patient requiring Dilaudid drip for comfort therefore we will continue hospital care for now.  Brief Hospital course Patient is a 67 year old male with past medical history of COPD and tobacco abuse who presented to the ER on 3/30 complaining of left upper extremity tremors and left-sided weakness as well as confusion.  He had been seen 2 days prior to emergency room for numbness of left upper extremity and was discharged home.  Since then, he started having jerking of his left upper extremity and twitching of his left side of his face.  In ER CT scan of head revealed hemorrhagic brain metastases (new finding/new diagnosis of cancer) and patient was transferred to [facility] for EEG.  Further workup revealed what appeared to be primary lung cancer.  Other labs noteworthy for urinary tract infection and UDS positive for cocaine and tricyclic antidepressants.  Patient admitted to the hospitalist service with neurology consultation and started on antiepileptics. Following admission, EEG done morning of 3/31 noting  electrographic status epilepticus arising from right central parietal region confirming ongoing epilepsy.  As day progressed, patient became more more agitated despite doses of Ativan.  By that night, transferred to ICU on Precedex drip with critical care following.  Patient underwent ultrasound-guided biopsy of left scapular mass by interventional radiology on 4/1. 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  At this point family decided to transition to complete comfort and consultation with palliative care. Currently anticipating hospital death.   Principal Problem:   Simple partial status epilepticus Active Problems:   Lung cancer metastatic to brain   Acute metabolic encephalopathy   Chronic obstructive pulmonary disease   UTI (urinary tract infection)   Cocaine abuse   Overweight (BMI 25.0-29.9)    Author: Lynden Oxford, MD [facility] 04/04/2023 5:12 PM   If 7PM-7AM, please contact night-coverage at [email or web address]

## 2023-04-04 NOTE — Progress Notes (Signed)
Pt breathing Is labored and shallow. Pt also has hiccups. Pt does not respond to voice or pain. Pt eyes closed. Radial pulse is bounding +2 bilaterally. Pt does not have the same 6-8 sec breathing pauses from the previous 2 nights. Mouth care completed Pt repositioned, gown changed and pericare performed at this time. Complete bed change is completed. Pt face and eyes washed and skin care performed. Pt able to tolerate reclined position. Skin is hot and clammy.

## 2023-04-04 NOTE — Progress Notes (Signed)
                                                     Palliative Care Progress Note, Assessment & Plan   Patient Name: Randy Ray       Date: 04/04/2023 DOB: 09/11/1956  Age: 67 y.o. MRN#: [id number] Attending Physician: Rolly Salter, MD Primary Care Physician: Pcp, No Admit Date: 03/03/2023  Subjective: Patient is lying in bed in no distress. His respirations are shallow. Terminal secretions are audible without use of stethoscope but patient appears overall comfortable. No family/friends present at bedside.    HPI: 67 y.o. male  with past medical history of COPD and tobacco abuse who presented to the emergency room on 3/30 complaining of left upper extremity tremors and left-sided weakness as well as confusion. 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   Decision was made to shift to full comfort measures on 4/4.  Summary of counseling/coordination of care: After reviewing the patient's chart and assessing the patient at bedside, I counseled with attending Dr. Allena Katz in regards to symptom management.  Patient's terminal secretions appear to be significantly improved with use of scopolamine and increased dosing of Robinul.  Patient's breathing is continuing to be shallow and mildly labored.  Basal rate of Dilaudid drip increased to 1 mg/h and RN notified of adjustment.  No further adjustments to medications needed for optimizing patient's symptom management at this time.  Anticipate an in-hospital death.  Physical Exam Vitals reviewed.  Constitutional:      General: He is not in acute distress.    Appearance: He is ill-appearing.  HENT:     Head:  Normocephalic.  Cardiovascular:     Rate and Rhythm: Normal rate.     Pulses: Normal pulses.  Pulmonary:     Comments: shallow Abdominal:     Palpations: Abdomen is soft.  Skin:    General: Skin is warm.             Total Time 25 minutes   Shawneequa Baldridge L. Manon Hilding, FNP-BC Palliative Medicine Team Team Phone # [phone]

## 2023-04-05 DIAGNOSIS — G40901 Epilepsy, unspecified, not intractable, with status epilepticus: Secondary | ICD-10-CM

## 2023-04-27 NOTE — Progress Notes (Signed)
    OVERNIGHT PROGRESS REPORT  Notified by RN that patient is deceased as of 74 Hrs  Patient was DNR/CMO (comfort measures only)  2 RN verified.     Chinita Greenland MSNA ACNPC-AG Acute Care Nurse Practitioner [facility]

## 2023-04-27 NOTE — Discharge Summary (Signed)
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  Imaging revealed hemorrhagic brain mets. Seizures/simple parital status epilepticus.  Lung cancer - apparent bronchogenic carcinoma with lad and hemorrhagic brain mets.   Was started on Decadron.  Keppra, vimpat. Depakote  Plan for onc and pall care consults.  UTI also had been noted and started on CTX ( though I do not see record of this on charting, I am starting it now)   IR consulted, planned for  Guided bx of L scapular lesion tomorrow 03/28/23   Earlier today was alert, but disoriented and at times confused.   More agitated per note at 740pm.    Given ativan 2mg , haldol, no improvement.  "  Hospital Course:  Patient is a 67 year old male with past medical history of COPD and tobacco abuse who presented to the ER on 04/18/23 complaining of left upper extremity tremors and left-sided weakness as well as confusion.  He had been seen 2 days prior to emergency room for numbness of left upper extremity and was discharged home.  Since then, he started having jerking of his left upper extremity and  twitching of his left side of his face.  In ER CT scan of head revealed hemorrhagic brain metastases (new finding/new diagnosis of cancer) and patient was transferred to [facility] for EEG.  Further workup revealed what appeared to be primary lung cancer.  Other labs noteworthy for urinary tract infection and UDS positive for cocaine and tricyclic antidepressants.  Patient admitted to the hospitalist service with neurology consultation and started on antiepileptics. Following admission, EEG done morning of 3/31 noting electrographic status epilepticus arising from right central parietal region confirming ongoing epilepsy.  As day progressed, patient became more more agitated despite doses of Ativan.  By that night, transferred to ICU on Precedex drip with critical care following.  Patient underwent ultrasound-guided biopsy of left scapular mass by interventional radiology on 4/1. 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  At this point family decided to transition to complete comfort and consultation with palliative care.  Focal convulsive status epilepticus,  Cerebral edema due to brain metastasis Hyperammonemia Acute hyperactive delirium. Acute combination of toxic encephalopathy as well as postictal encephalopathy. E. coli UTI. Presented with confusion and tremors. Workup showed status epilepticus. Admitted to the ICU. Neurology and palliative care were following. 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 Also had UTI treated with antibiotics. Currently comfort care. seizure medication were continued   Alcohol dependence. Polysubstance abuse. UDS positive for cocaine,  TCA, benzo. Likely responsible for some of the agitation seen in the hospital earlier.  Currently comfort care.   Secretion issues. Currently on scheduled Robinul as well as scopolamine  patch. Will monitor.   COPD. Active smoker. On nicotine patch which was removed. Continue comfort care.   Fever. Currently on Toradol scheduled Monitor.  Continue comfort.   Goals of care conversation. Patient presented with tremors.  Found to have hemorrhagic brain metastasis.  Developed status epilepticus.  Found to have poorly differentiated metastatic lung cancer with metastasis to brain and bones.  No significant improvement in mentation despite aggressive therapy for status epilepticus. Palliative care was consulted and family has decided to transition to complete comfort. Currently anticipating hospital death.  The patient was pronounced deceased at 04:50, on 04/06/2023.  Procedures and Results: EEG   Consultations: Neurology  PCCM  Palliative care   The results of significant diagnostics from this hospitalization (including imaging, microbiology, ancillary and laboratory) are listed below for reference.    Significant Diagnostic Studies: DG CHEST PORT 1 VIEW  Result Date: 04/01/2023 CLINICAL DATA:  67 year old male with metastatic disease.  Hypoxia. EXAM: PORTABLE CHEST 1 VIEW COMPARISON:  CT Chest, Abdomen, and Pelvis August 22, 2023. FINDINGS: Portable AP semi upright view at 0519 hours. Upper lobe predominant emphysema demonstrated on recent CT. Left lingula mass demonstrated at that time, surrounding left lower lung airspace disease appears increased since the CT with combined air bronchograms and reticulonodular opacity. Mediastinal contours are stable. Right lung appears stable, negative. No pneumothorax. Enteric feeding tube in place, terminates in the stomach. Negative visible bowel gas. No acute osseous abnormality identified. IMPRESSION: 1. Increasing left lower lung airspace disease superimposed on lingula lung mass recently diagnosed on CT. This may be superimposed pneumonia. Aspiration felt less likely. 2. Enteric feeding tube terminates in the stomach. Electronically  Signed   By: Odessa FlemingH  Hall M.D.   On: 04/01/2023 05:48   DG Abd Portable 1V  Result Date: 03/30/2023 CLINICAL DATA:  Feeding tube placement EXAM: PORTABLE ABDOMEN - 1 VIEW COMPARISON:  Portable exam 1158 hours without priors for comparison FINDINGS: Feeding tube extends into stomach with tip at fundus. Nonobstructive bowel gas pattern. LEFT lung base clear. IMPRESSION: Tip of feeding tube at gastric fundus. Electronically Signed   By: Ulyses SouthwardMark  Boles M.D.   On: 03/30/2023 12:19   US CORE BIOPSY (SOFT TISSUE)  Result Date: 03/28/2023 INDICATION: 67 year old gentleman with lung malignancy and metastatic lesion to the left scapula presents to IR for ultrasound-guided biopsy EXAM: Ultrasound-guided biopsy of left scapular mass MEDICATIONS: None. ANESTHESIA/SEDATION: None COMPLICATIONS: None immediate. PROCEDURE: Informed written consent was obtained from the patient's sister after a thorough discussion of the procedural risks, benefits and alternatives. All questions were addressed. Maximal Sterile Barrier Technique was utilized including caps, mask, sterile gowns, sterile gloves, sterile drape, hand hygiene and skin antiseptic. A timeout was performed prior to the initiation of the procedure. Patient positioned right lateral decubitus on the procedure table. Left posterior chest wall skin prepped and draped in usual fashion. Following local lidocaine administration, 4-18 gauge cores were obtained from the left scapular mass utilizing continuous ultrasound guidance. All samples were sent to pathology in formalin. IMPRESSION: Ultrasound-guided core needle biopsy of left scapular mass. Electronically Signed   By: Acquanetta BellingFarhaan  Mir M.D.   On: 03/28/2023 16:37   Overnight EEG with video  Result Date: 03/27/2023 Charlsie QuestYadav, Priyanka O, MD     03/28/2023 10:05 AM Patient Name: Randy SeveranceJames E Ray MRN: [id number] Epilepsy Attending: Kristopher OppenheimPriyanka O  Melynda Ripple Referring Physician/Provider: Erick Blinks, MD Duration: 04-08-2023 2240 to 03/27/2023 2240  Patient history: 67 yo man with hx tobacco abuse who presented with AMS and LUE focal status. EEG to evaluate for seizure. Level of alertness: Awake, asleep AEDs during EEG study: LEV, LCM, VPA Technical aspects: This EEG study was done with scalp electrodes positioned according to the 10-20 International system of electrode placement. Electrical activity was reviewed with band pass filter of 1-70Hz , sensitivity of 7 uV/mm, display speed of 48mm/sec with a 60Hz  notched filter applied as appropriate. EEG data were recorded continuously and digitally stored.  Video monitoring was available and reviewed as appropriate. Description: No clear posterior dominant rhythm was seen. Sleep was characterized by vertex waves, sleep spindles (12 to 14 Hz), maximal frontocentral region. EEG showed continuous generalized and maximal right centro-parietal region 3 to 6 Hz theta-delta slowing. Seizure were noted arising from right centro-parietal region. EEG showed sharp waves in right centro-parietal region which appear periodic at 1hz  admixed with 4-5hz  theta slowing. Sharp waves then increases in frequency to 2hz  and 2-3hz  delta slowing. It also involved vertex region. No clinical signs were noted. In between seizures, EEG showed periodic discharges in right centro-parietal region. 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 Patient was loaded with valproic acid at around 1430. Subsequently, status epilepticus resolved. EEG showed lateralized periodic discharges at 1hz  in right hemisphere, maximal right centro-parietal region Hyperventilation and photic stimulation were not performed.  Of note, parts of study were difficult to interpret due to significant electrode artifact. ABNORMALITY - Electrographic status epilepticus, right centro-parietal region - Lateralized periodic discharges, right hemisphere, maximal right centro-parietal region ( LPD) - Continuous  slow, generalized and maximal right centro-parietal region IMPRESSION: This study initially showed electrographic status epilepticus arising from right centro-parietal region. Patient was loaded with valproic acid at around 1430 on 03/27/2023. Subsequently, status epilepticus resolved. EEG was then suggestive of epileptogenicity arising from right hemisphere, maximal right centro-parietal region with increased risk of seizure recurrence. Additionally there was cortical dysfunction in right centro-parietal region likely due to seizures, underlying structural abnormality. Charlsie Quest   CT CHEST ABDOMEN PELVIS W CONTRAST  Result Date: 08-Apr-2023 CLINICAL DATA:  Brain metastasis. Evaluate for primary malignancy. * Tracking Code: BO * EXAM: CT CHEST, ABDOMEN, AND PELVIS WITH CONTRAST TECHNIQUE: Multidetector CT imaging of the chest, abdomen and pelvis was performed following the standard protocol during bolus administration of intravenous contrast. RADIATION DOSE REDUCTION: This exam was performed according to the departmental dose-optimization program which includes automated exposure control, adjustment of the mA and/or kV according to patient size and/or use of iterative reconstruction technique. CONTRAST:  OMNIPAQUE IOHEXOL 300 MG/ML  SOLN COMPARISON:  Chest radiograph 02/19/2022.  CTA chest 09/09/2020 FINDINGS: CT CHEST FINDINGS Cardiovascular: Bovine arch. Aortic atherosclerosis. Tortuous thoracic aorta. Mild cardiomegaly, without pericardial effusion. Left main coronary artery calcification. No central pulmonary embolism, on this non-dedicated study. Mediastinum/Nodes: No supraclavicular adenopathy. No axillary adenopathy. Mediastinal adenopathy, with a subcarinal node measuring 2.2 x 4.2 cm on 28/2. AP window/left suprahilar necrotic node measures 1.5 x 2.1 cm on 24/2. Lungs/Pleura: No pleural fluid. Secretions in the right side of the trachea. Advanced bullous emphysema. Mild limitations as  patient's arms are not raised above the head. Pleural-based lingular mass with mild surrounding ground-glass measures 5.8 x 3.5 cm on 80/4. 3.4 cm craniocaudal on coronal image 57. Musculoskeletal: Mild right-sided gynecomastia. Lytic lesion within the left side of the inferior scapula including  at 2.4 cm on 22/2. CT ABDOMEN PELVIS FINDINGS Hepatobiliary: Focal steatosis adjacent the falciform ligament. Normal gallbladder, without biliary ductal dilatation. Pancreas: Normal, without mass or ductal dilatation. Spleen: Normal in size, without focal abnormality. Adrenals/Urinary Tract: Normal adrenal glands. Bilateral too small to characterize renal lesions . In the absence of clinically indicated signs/symptoms require(s) no independent follow-up. No hydronephrosis. Normal urinary bladder. Stomach/Bowel: Normal stomach, without wall thickening. Scattered colonic diverticula. Normal terminal ileum and appendix. Composition degrades evaluation of the abdomen as well. Normal small bowel. Vascular/Lymphatic: Aortic atherosclerosis. No abdominopelvic adenopathy. Reproductive: Mild prostatomegaly. Other: No significant free fluid. No evidence of omental or peritoneal disease. Musculoskeletal: Sclerotic tiny foci in both femoral heads are suspicious for minimal avascular necrosis. Transitional L5 vertebral body. IMPRESSION: 1. Mild patient position degradation. 2. 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 3. Isolated osseous metastasis within the left scapula. 4. No subdiaphragmatic soft tissue metastasis identified. 5. 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 6. Aortic atherosclerosis (ICD10-I70.0) and emphysema (ICD10-J43.9). Electronically Signed   By: Jeronimo Greaves M.D.   On: 03/09/2023 15:13   MR Brain W and Wo Contrast  Result Date: 03/17/2023 CLINICAL DATA:  Seizure EXAM: MRI HEAD WITHOUT AND WITH CONTRAST TECHNIQUE: Multiplanar, multiecho pulse sequences  of the brain and surrounding structures were obtained without and with intravenous contrast. CONTRAST:  10mL GADAVIST GADOBUTROL 1 MMOL/ML IV SOLN COMPARISON:  Same day CT brain FINDINGS: Brain: Negative for an acute infarct. No hydrocephalus. No extra-axial fluid collection. There are multiple intracranial contrast-enhancing lesions, as below - 1. There is a 1.3 x 0.9 cm contrast-enhancing lesion in the right frontal lobe (series 24, image 108). There is surrounding vasogenic edema. There is susceptibility artifact associated with the lesion, compatible with intralesional blood products. 2. There is a 2.0 x 2.0 x 2.1 cm contrast-enhancing lesion in the left parietal lobe (series 24, image 114). There is surrounding vasogenic edema. 3. There is a heterogeneous dural-based contrast-enhancing lesion along the posterior right frontal convexity measuring 1.8 x 1.6 x 3.0 cm (series 24, image 147). This lesion demonstrates layering blood products, compatible with frank hemorrhage. 4. There is a 0.7 x 0.5 cm contrast-enhancing lesion in the left frontal lobe/centrum semiovale (series 44, image 121). There is susceptibility artifact associated with this lesion, compatible with intralesional blood products. 5. There is a 0.5 x 0.5 cm lesion in the left frontal lobe (series 24, image 134) Vascular: Normal flow voids. Skull and upper cervical spine: Normal marrow signal. Sinuses/Orbits: No middle ear or mastoid effusion. Paranasal sinuses are clear. There is a chronic fracture of the left lamina papyracea. Other: None. IMPRESSION: Multiple intracranial contrast-enhancing lesions (5 lesions), compatible with metastatic disease. The largest lesion is in the left parietal lobe measuring up to 2.1 cm. Dural-based lesion along the posterior right frontal convexity demonstrates layering blood products. Electronically Signed   By: Lorenza Cambridge M.D.   On: 03/09/2023 12:29   CT Head Wo Contrast  Result Date: 03/20/2023 CLINICAL  DATA:  Seizure.  New onset. EXAM: CT HEAD WITHOUT CONTRAST TECHNIQUE: Contiguous axial images were obtained from the base of the skull through the vertex without intravenous contrast. RADIATION DOSE REDUCTION: This exam was performed according to the departmental dose-optimization program which includes automated exposure control, adjustment of the mA and/or kV according to patient size and/or use of iterative reconstruction technique. COMPARISON:  None Available. FINDINGS: Brain: There is a 2.1 x 1.7 x 2.2 cm lesion in the high posterior  right frontal lobe (series 3, image 28) with surrounding vasogenic edema and intralesional blood products. There is an additional 2.0 x 1.7 x 1.9 cm lesion in the left frontoparietal region which likely also has a small amount of intralesional blood products. There is an additional area of which edema in the inferior right frontal lobe (series 3, image 12), which likely represents an additional site of intracranial disease. No hydrocephalus. No extra-axial fluid collection. Vascular: No hyperdense vessel or unexpected calcification. Skull: Normal. Negative for fracture or focal lesion. Sinuses/Orbits: No middle ear or mastoid effusion. Paranasal sinuses are notable for mild mucosal thickening in the right maxillary sinus. Orbits are unremarkable. Other: None. IMPRESSION: Two hemorrhagic lesions in the high posterior right frontal lobe and left frontoparietal region with surrounding vasogenic edema concerning for metastatic disease. Additional area of vasogenic edema in the inferior right frontal lobe likely represents an additional site of intracranial disease. Recommend contrast enhanced brain MRI for further evaluation. Given patient's history of COPD, lung primary is a potential consideration. Electronically Signed   By: Lorenza Cambridge M.D.   On: 03/14/2023 11:36   DG Shoulder Left  Result Date: 03/24/2023 CLINICAL DATA:  Pain, numbness EXAM: LEFT SHOULDER - 3 VIEW  COMPARISON:  No prior shoulder radiograph available FINDINGS: There is no evidence of fracture or dislocation. There is no evidence of arthropathy or other focal bone abnormality. Soft tissues are unremarkable. IMPRESSION: Negative. Electronically Signed   By: Wiliam Ke M.D.   On: 03/24/2023 10:55    Microbiology: Recent Results (from the past 240 hour(s))  MRSA Next Gen by PCR, Nasal     Status: None   Collection Time: 03/28/23  2:51 AM   Specimen: Nasal Mucosa; Nasal Swab  Result Value Ref Range Status   MRSA by PCR Next Gen NOT DETECTED NOT DETECTED Final    Comment: (NOTE) The GeneXpert MRSA Assay (FDA approved for NASAL specimens only), is one component of a comprehensive MRSA colonization surveillance program. It is not intended to diagnose MRSA infection nor to guide or monitor treatment for MRSA infections. Test performance is not FDA approved in patients less than 40 years old. Performed at [facility], [address] [place], [place] [address]      Labs: CBC: No results for input(s): "WBC", "NEUTROABS", "HGB", "HCT", "MCV", "PLT" in the last 168 hours. Basic Metabolic Panel: Recent Labs  Lab 03/31/23 0614  MG 2.2  PHOS 4.7*   Liver Function Tests: No results for input(s): "AST", "ALT", "ALKPHOS", "BILITOT", "PROT", "ALBUMIN" in the last 168 hours. Cardiac Enzymes: No results for input(s): "CKTOTAL", "CKMB", "CKMBINDEX", "TROPONINI" in the last 168 hours.  Time spent: 30 minutes  Signed:  Lynden Oxford  [facility]  04-25-2023

## 2023-04-27 NOTE — Progress Notes (Signed)
Spoke with patient's niece, Juniper Formella who stated that she was unsuccessful with contacting Juanito Doom. However her uncle will be going to Angela's house and will probably be coming to unit. Requests that we hold body on unit until further notification.

## 2023-04-27 NOTE — Progress Notes (Signed)
Pt noted without pulse, breathless, unresponsive. Pronounced deceased at 42. Dilaudid 25 ml wasted with Hester Mates. Pt's fiance notified of passing, Vikram Stauffacher. Per Marylene Land, pt's sister Bufford Lope would be appointed person to handle arrangements. Attempt made x2 to reach Iantha, with voicemail left for call back. Pt's niece, Daxten Cardoni contacted to get in touch with Wille Celeste, and stated she would have [facility] call hospital.

## 2023-04-27 DEATH — deceased
